# Patient Record
Sex: Female | Born: 1975 | Race: Black or African American | Hispanic: No | Marital: Married | Smoking: Never smoker
Health system: Southern US, Community
[De-identification: ages and names within clinical notes are randomized; demographics above are authoritative.]

## PROBLEM LIST (undated history)

## (undated) DIAGNOSIS — O3430 Maternal care for cervical incompetence, unspecified trimester: Secondary | ICD-10-CM

## (undated) DIAGNOSIS — E282 Polycystic ovarian syndrome: Secondary | ICD-10-CM

## (undated) DIAGNOSIS — O165 Unspecified maternal hypertension, complicating the puerperium: Secondary | ICD-10-CM

## (undated) DIAGNOSIS — Z9889 Other specified postprocedural states: Secondary | ICD-10-CM

## (undated) DIAGNOSIS — Z9079 Acquired absence of other genital organ(s): Secondary | ICD-10-CM

## (undated) HISTORY — PX: ECTOPIC PREGNANCY SURGERY: SHX613

## (undated) HISTORY — PX: SALPINGECTOMY: SHX328

---

## 2011-10-05 ENCOUNTER — Encounter: Payer: Self-pay | Admitting: *Deleted

## 2011-10-05 ENCOUNTER — Ambulatory Visit (INDEPENDENT_AMBULATORY_CARE_PROVIDER_SITE_OTHER): Payer: 59

## 2011-10-05 ENCOUNTER — Emergency Department (HOSPITAL_COMMUNITY): Payer: 59

## 2011-10-05 ENCOUNTER — Emergency Department (HOSPITAL_COMMUNITY)
Admission: EM | Admit: 2011-10-05 | Discharge: 2011-10-05 | Disposition: A | Discharge status: Home / Self Care | Payer: 59 | Attending: Emergency Medicine | Admitting: Emergency Medicine

## 2011-10-05 DIAGNOSIS — K429 Umbilical hernia without obstruction or gangrene: Secondary | ICD-10-CM

## 2011-10-05 DIAGNOSIS — N949 Unspecified condition associated with female genital organs and menstrual cycle: Secondary | ICD-10-CM | POA: Insufficient documentation

## 2011-10-05 DIAGNOSIS — R1033 Periumbilical pain: Secondary | ICD-10-CM

## 2011-10-05 DIAGNOSIS — R10813 Right lower quadrant abdominal tenderness: Secondary | ICD-10-CM | POA: Insufficient documentation

## 2011-10-05 DIAGNOSIS — M549 Dorsalgia, unspecified: Secondary | ICD-10-CM | POA: Insufficient documentation

## 2011-10-05 DIAGNOSIS — R10815 Periumbilic abdominal tenderness: Secondary | ICD-10-CM | POA: Insufficient documentation

## 2011-10-05 DIAGNOSIS — R109 Unspecified abdominal pain: Secondary | ICD-10-CM | POA: Insufficient documentation

## 2011-10-05 DIAGNOSIS — R10819 Abdominal tenderness, unspecified site: Secondary | ICD-10-CM | POA: Insufficient documentation

## 2011-10-05 DIAGNOSIS — R10814 Left lower quadrant abdominal tenderness: Secondary | ICD-10-CM | POA: Insufficient documentation

## 2011-10-05 DIAGNOSIS — R112 Nausea with vomiting, unspecified: Secondary | ICD-10-CM | POA: Insufficient documentation

## 2011-10-05 DIAGNOSIS — R630 Anorexia: Secondary | ICD-10-CM | POA: Insufficient documentation

## 2011-10-05 DIAGNOSIS — N83209 Unspecified ovarian cyst, unspecified side: Secondary | ICD-10-CM

## 2011-10-05 LAB — DIFFERENTIAL
Basophils Absolute: 0 10*3/uL (ref 0.0–0.1)
Basophils Relative: 0 % (ref 0–1)
Eosinophils Absolute: 0 10*3/uL (ref 0.0–0.7)
Eosinophils Relative: 1 % (ref 0–5)
Lymphocytes Relative: 25 % (ref 12–46)
Lymphs Abs: 2 10*3/uL (ref 0.7–4.0)
Monocytes Absolute: 0.3 10*3/uL (ref 0.1–1.0)
Monocytes Relative: 4 % (ref 3–12)
Neutro Abs: 5.5 10*3/uL (ref 1.7–7.7)
Neutrophils Relative %: 70 % (ref 43–77)

## 2011-10-05 LAB — URINALYSIS, ROUTINE W REFLEX MICROSCOPIC
Bilirubin Urine: NEGATIVE
Glucose, UA: NEGATIVE mg/dL
Ketones, ur: NEGATIVE mg/dL
Leukocytes, UA: NEGATIVE
Nitrite: NEGATIVE
Protein, ur: NEGATIVE mg/dL
Specific Gravity, Urine: 1.014 (ref 1.005–1.030)
Urobilinogen, UA: 0.2 mg/dL (ref 0.0–1.0)
pH: 7 (ref 5.0–8.0)

## 2011-10-05 LAB — COMPREHENSIVE METABOLIC PANEL
ALT: 27 U/L (ref 0–35)
AST: 23 U/L (ref 0–37)
Albumin: 4.5 g/dL (ref 3.5–5.2)
Alkaline Phosphatase: 83 U/L (ref 39–117)
BUN: 7 mg/dL (ref 6–23)
CO2: 28 mEq/L (ref 19–32)
Calcium: 9.9 mg/dL (ref 8.4–10.5)
Chloride: 104 mEq/L (ref 96–112)
Creatinine, Ser: 0.61 mg/dL (ref 0.50–1.10)
GFR calc Af Amer: >90 mL/min (ref >90)
GFR calc non Af Amer: >90 mL/min (ref >90)
Glucose, Bld: 123 mg/dL — ABNORMAL HIGH (ref 70–99)
Potassium: 3.8 mEq/L (ref 3.5–5.1)
Sodium: 139 mEq/L (ref 135–145)
Total Bilirubin: 0.3 mg/dL (ref 0.3–1.2)
Total Protein: 8.4 g/dL — ABNORMAL HIGH (ref 6.0–8.3)

## 2011-10-05 LAB — URINE MICROSCOPIC-ADD ON

## 2011-10-05 LAB — CBC
HCT: 38.5 % (ref 36.0–46.0)
Hemoglobin: 13.1 g/dL (ref 12.0–15.0)
MCH: 29.7 pg (ref 26.0–34.0)
MCHC: 34 g/dL (ref 30.0–36.0)
MCV: 87.3 fL (ref 78.0–100.0)
Platelets: 275 10*3/uL (ref 150–400)
RBC: 4.41 MIL/uL (ref 3.87–5.11)
RDW: 13.8 % (ref 11.5–15.5)
WBC: 7.9 10*3/uL (ref 4.0–10.5)

## 2011-10-05 LAB — LIPASE, BLOOD: Lipase: 20 U/L (ref 11–59)

## 2011-10-05 MED ORDER — IOHEXOL 300 MG/ML  SOLN
100.0000 mL | Freq: Once | INTRAMUSCULAR | Status: AC | PRN
  Administered 2011-10-05: 100 mL via INTRAVENOUS

## 2011-10-05 NOTE — ED Provider Notes (Signed)
History     CSN: [id number] Arrival date & time: 10/05/2011 11:01 AM   First MD Initiated Contact with Patient 10/05/11 1115      Chief Complaint  Patient presents with  . Hernia  . Abdominal Pain  . Nausea  . Emesis    (Consider location/radiation/quality/duration/timing/severity/associated sxs/prior treatment) Patient is a 35 y.o. female presenting with abdominal pain and vomiting. The history is provided by the patient.  Abdominal Pain The primary symptoms of the illness include abdominal pain, nausea and vomiting. The primary symptoms of the illness do not include diarrhea, dysuria, vaginal discharge or vaginal bleeding. The current episode started 13 to 24 hours ago. The onset of the illness was sudden.  The patient has not had a change in bowel habit. Risk factors for an acute abdominal problem include a history of abdominal surgery. Additional symptoms associated with the illness include anorexia and back pain. Symptoms associated with the illness do not include diaphoresis, heartburn, constipation, urgency, hematuria or frequency. Significant associated medical issues include diabetes.  Emesis  Associated symptoms include abdominal pain. Pertinent negatives include no diarrhea.    Penny [name] is a 35 y.o. female who presents to the emergency department from [facility] for abdominal pain.  Pt states pain began yesterday about 11 am and persisted throughout the night.  The pain was described as a 10/10 this located across the low abdomen, worst in the center up to her umbilicus and radiating to her low back.  The pain was described as sharp and constant.  Pt then began vomiting; denies hematemesis.  She had a BM this AM that was normal and she denies diarrhea.  Pt and her husband have been undergoing fertility treatments to become pregnant.  [id number].  R ectopic pregnancy in Aug 2012 requiring surgical intervention.  Home UPT was negative this AM.  LMP 4 weeks ago.   Pt has an umbilical hernia that has been present since birth.  Pt states no change in size, coloration or tenderness of this area.  Denies fever, malaise, CP, SOB, vaginal discharge or bleeding, dysuria, hematuria, frequency, urgency, constipation or hematochezia.    History reviewed. No pertinent past medical history.  Past Surgical History  Procedure Date  . Ectopic pregnancy surgery     No family history on file.  History  Substance Use Topics  . Smoking status: Never Smoker   . Smokeless tobacco: Not on file  . Alcohol Use: No    OB History    Grav Para Term Preterm Abortions TAB SAB Ect Mult Living                  Review of Systems  Constitutional: Negative for diaphoresis.  Gastrointestinal: Positive for nausea, vomiting, abdominal pain and anorexia. Negative for heartburn, diarrhea, constipation, blood in stool and abdominal distention.  Genitourinary: Negative for dysuria, urgency, frequency, hematuria, flank pain, vaginal bleeding, vaginal discharge, difficulty urinating and vaginal pain.  Musculoskeletal: Positive for back pain.   All pertinent positives and negatives in the history of present illness  Allergies  Review of patient's allergies indicates no known allergies.  Home Medications   Current Outpatient Rx  Name Route Sig Dispense Refill  . OMEGA-3 FATTY ACIDS 1000 MG PO CAPS Oral Take 1 g by mouth daily.      Marland Kitchen FOLIC ACID 400 MCG PO TABS Oral Take 800 mcg by mouth daily.      Marland Kitchen HYDROCODONE-ACETAMINOPHEN 5-500 MG PO TABS Oral Take 1 tablet  by mouth every 6 (six) hours as needed. PAIN     . METFORMIN HCL ER (OSM) 500 MG PO TB24 Oral Take 1,000 mg by mouth 2 (two) times daily.      . MULTI-VITAMIN/MINERALS PO TABS Oral Take 1 tablet by mouth daily.      Marland Kitchen VITAMIN C 500 MG PO TABS Oral Take 1,000 mg by mouth daily.        BP 149/86  Pulse 66  Temp 97.5 F (36.4 C)  Resp 16  SpO2 100%  LMP 09/14/2011  Physical Exam  Constitutional: She is  oriented to person, place, and time. She appears well-developed and well-nourished. No distress.  HENT:  Head: Normocephalic and atraumatic.  Right Ear: External ear normal.  Left Ear: External ear normal.  Eyes: Conjunctivae are normal. Pupils are equal, round, and reactive to light.  Neck: Normal range of motion. Neck supple.  Cardiovascular: Normal rate, regular rhythm, normal heart sounds and intact distal pulses.  Exam reveals no gallop and no friction rub.   No murmur heard. Pulmonary/Chest: Effort normal and breath sounds normal. No respiratory distress. She has no wheezes. She has no rales. She exhibits no tenderness.  Abdominal: Soft. Bowel sounds are normal. There is no hepatosplenomegaly. There is tenderness in the right lower quadrant, periumbilical area, suprapubic area and left lower quadrant. There is no rigidity, no rebound, no guarding and no CVA tenderness. A hernia (umbilical ) is present.    Musculoskeletal: Normal range of motion.  Lymphadenopathy:    She has no cervical adenopathy.  Neurological: She is alert and oriented to person, place, and time.  Skin: Skin is warm and dry. She is not diaphoretic.  Psychiatric: She has a normal mood and affect. Her behavior is normal. Judgment and thought content normal.    ED Course  Procedures (including critical care time)   Labs Reviewed  POCT PREGNANCY, URINE  POCT PREGNANCY, URINE      Patient has been stable during her emergency room visit.  Patient's pain is most likely associated with these multiple cystic areas on her enlarged right ovary.  She does have an umbilical hernia that has been present all of her life she states and does cause pain from time to time.  On my exam the patient did have umbilical hernia discomfort but is reducible and nonincarcerated.  Patient is advised to follow up with her GYN doctor for recheck.  Told to return here as needed for any worsening in her condition   MDM  MDM Reviewed:  nursing note and vitals Interpretation: labs, ultrasound and CT scan            Carlyle Dolly, PA-C 10/05/11 1833

## 2011-10-05 NOTE — ED Provider Notes (Signed)
Medical screening examination/treatment/procedure(s) were conducted as a shared visit with non-physician practitioner(s) and myself.  I personally evaluated the patient during the encounter  Lower abdominal pain since last night.  Associated with nausea.  Abdomen soft.  Easily reducible small umbilical hernia.  Glynn Octave, MD 10/05/11 [phone]

## 2011-10-05 NOTE — ED Notes (Signed)
Pt back from ct

## 2011-10-05 NOTE — ED Notes (Signed)
Pt sent from [facility] for eval for suspected incarcerated umbilical hernia. C/o low abd pain ,nausea, loss of appetite yesterday. Began vomiting this am.

## 2012-02-07 ENCOUNTER — Encounter (HOSPITAL_COMMUNITY): Payer: Self-pay

## 2012-02-07 ENCOUNTER — Emergency Department (HOSPITAL_COMMUNITY)
Admission: EM | Admit: 2012-02-07 | Discharge: 2012-02-07 | Disposition: A | Discharge status: Home / Self Care | Payer: Worker's Compensation | Attending: Emergency Medicine | Admitting: Emergency Medicine

## 2012-02-07 DIAGNOSIS — W19XXXA Unspecified fall, initial encounter: Secondary | ICD-10-CM

## 2012-02-07 DIAGNOSIS — M259 Joint disorder, unspecified: Secondary | ICD-10-CM | POA: Insufficient documentation

## 2012-02-07 DIAGNOSIS — M79609 Pain in unspecified limb: Secondary | ICD-10-CM | POA: Insufficient documentation

## 2012-02-07 DIAGNOSIS — M899 Disorder of bone, unspecified: Secondary | ICD-10-CM | POA: Insufficient documentation

## 2012-02-07 NOTE — ED Provider Notes (Signed)
Medical screening examination/treatment/procedure(s) were performed by non-physician practitioner and as supervising physician I was immediately available for consultation/collaboration.   Elridge Stemm, MD 02/07/12 2323 

## 2012-02-07 NOTE — Discharge Instructions (Signed)
Injuries In Pregnancy  Trauma is the most common cause of injury and death in pregnant women. The most common cause of death to the fetus is injury and death of the pregnant mother.   Minor falls and minor automobile accidents do not usually harm the fetus. The fetus is protected in the womb by a sac filled with fluid. The fetus can be harmed if there is direct trauma to your abdomen and pelvis. Direct trauma to the uterus and placenta can affect the blood supply to the fetus. Major trauma causing significant bleeding and shock to the mother can also compromise and jeopardize the fetal blood supply.  It is important to know your blood type and the father's blood type in case you develop vaginal bleeding. If you are RH negative and have sustained serious trauma or develop vaginal bleeding, you will need to have medicine (RhoGAM [Rh immune globulin]) to avoid Rh problems in future pregnancies.  CAUSES   Falls are more common in the second and third trimester of the pregnancy. Factors that increase your risk of falling include:   Increase in weight.   The change of your center of gravity.   Tripping over an object that cannot be seen.   Automobile accidents. It is important to wear a seat belt and always practice safe driving.   Domestic violence or assault. Dial your local emergency services (911 in the [place]). Spousal abuse can be a significant cause of trauma during pregnancy.   Burns (fire or electrical). Avoid fires, starting fires, lifting heavy pots of boiling or hot liquids, and fixing electrical problems.  The most common causes of death to the pregnant woman include:   Injuries that cause severe bleeding, shock and loss of blood flow to the mother's major organs.   Head and neck injuries that result in severe brain or spinal damage.   Chest trauma that can cause direct injury to the heart and lungs or any injury that effects the area enclosed by the ribs (thorax). Trauma to this area can result in  cardio-respiratory arrest.  Symptoms and treatment will depend on the type of injury.  HOME CARE INSTRUCTIONS    Call your caregiver if you are in a car accident, even if you think you and the baby are not hurt. Your caregiver may want you to have a precautionary evaluation.   Do not take aspirin. It can worsen bleeding.   You may apply cold packs 3 to 4 times a day to the injury with your caregiver's permission.   After 24 hours apply warm compresses to the injured site with your caregiver's permission.   Call your caregiver if you are having increasing pain in any part of your body that is not remedied by your instructed home care.   In a severe injury, try to have someone be with you and help you until you are able to take care of yourself.   Do not wear high heel shoes while pregnant.   Remove slippery rugs and loose objects on the floor.  SEEK IMMEDIATE MEDICAL CARE IF:    You have been assaulted (domestic or otherwise).   You have been in a car accident.   You develop vaginal bleeding.   You develop fluid leaking from the vagina.   You develop uterine contractions (pelvic cramping or pain).   You develop neck stiffness or pain.   You become weak or faint, or have uncontrolled vomiting after trauma.   You had a serious burn.   This includes burns to the face, neck, hands or genitals, or burns greater than the size of your palm anywhere else.   You develop a headache or vision problems after a fall or from other trauma.   You do not feel the baby moving or the baby is not moving as much as before.  Document Released: 11/10/2004 Document Revised: 09/22/2011 Document Reviewed: 02/15/2010  Exit[employer] Patient Information 2012 [employer].

## 2012-02-07 NOTE — ED Provider Notes (Signed)
History     CSN: [id number]  Arrival date & time 02/07/12  1831   First MD Initiated Contact with Patient 02/07/12 1920      Chief Complaint  Patient presents with  . Fall    (Consider location/radiation/quality/duration/timing/severity/associated sxs/prior treatment) HPI  Pt presents to the ED with complaints of having a fall while at work. She went to sit in a chair and it came out from behind her and she landed on her gluteus cheek and hit her left arm. She also admits to being 1 month pregnant. But she denies contractions, vaginal bleeding, back pain or abdominal pain. She came to the ED because her boss from work said she had to be medically cleared before she could come back. She is in no acute distress, denies my offer for pain medication saying that it does not hurt that bad.  History reviewed. No pertinent past medical history.  Past Surgical History  Procedure Date  . Ectopic pregnancy surgery     No family history on file.  History  Substance Use Topics  . Smoking status: Never Smoker   . Smokeless tobacco: Not on file  . Alcohol Use: No    OB History    Grav Para Term Preterm Abortions TAB SAB Ect Mult Living                  Review of Systems   HEENT: denies blurry vision or change in hearing PULMONARY: Denies difficulty breathing and SOB CARDIAC: denies chest pain or heart palpitations MUSCULOSKELETAL:  denies being unable to ambulate ABDOMEN AL: denies abdominal pain GU: denies loss of bowel or urinary control NEURO: denies numbness and tingling in extremities   Allergies  Review of patient's allergies indicates no known allergies.  Home Medications   Current Outpatient Rx  Name Route Sig Dispense Refill  . OMEGA-3 FATTY ACIDS 1000 MG PO CAPS Oral Take 1 g by mouth daily.      Marland Kitchen FOLIC ACID 400 MCG PO TABS Oral Take 800 mcg by mouth daily.      Marland Kitchen METFORMIN HCL ER (OSM) 500 MG PO TB24 Oral Take 1,000 mg by mouth 2 (two) times daily.      .  MULTI-VITAMIN/MINERALS PO TABS Oral Take 1 tablet by mouth daily.      Marland Kitchen VITAMIN C 500 MG PO TABS Oral Take 1,000 mg by mouth daily.        BP 141/79  Pulse 67  Temp(Src) 98.2 F (36.8 C) (Oral)  Resp 18  SpO2 100%  LMP 12/10/2011  Physical Exam  Nursing note and vitals reviewed. Constitutional: She appears well-developed and well-nourished. No distress.  HENT:  Head: Normocephalic and atraumatic.  Eyes: Pupils are equal, round, and reactive to light.  Neck: Normal range of motion. Neck supple.  Cardiovascular: Normal rate and regular rhythm.   Pulmonary/Chest: Effort normal.  Abdominal: Soft. Bowel sounds are normal. She exhibits distension (pt is pregnant). She exhibits no mass. There is no tenderness. There is no rebound and no guarding.  Musculoskeletal:       Right hip: Normal.       Left upper arm: She exhibits tenderness (to the bicep muscle- full strength and ROM) and bony tenderness. She exhibits no swelling, no edema, no deformity and no laceration.  Neurological: She is alert.  Skin: Skin is warm and dry.    ED Course  Procedures (including critical care time)  Labs Reviewed - No data to display No results  found.   1. Fall       MDM  Due to patient being pregnant, symptoms of her hip are not impressive and do not warrant xrays. The patient declined xray on her left humerus, saying she doesn't need it. She is seeing her OB doctor this week. We have described in length symptoms that should bring the patient back to the ED. I will start on prenatal vitamins.   Pt has been advised of the symptoms that warrant their return to the ED. Patient has voiced understanding and has agreed to follow-up with the PCP or specialist.         Dorthula Matas, PA 02/07/12 1943

## 2012-02-07 NOTE — ED Notes (Signed)
Pt in with c/o with right hip pain and left arm pain states fell denies head injury denies loc

## 2012-02-24 LAB — OB RESULTS CONSOLE HGB/HCT, BLOOD
HCT: 38 %
Hemoglobin: 13.2 g/dL

## 2012-02-24 LAB — OB RESULTS CONSOLE HIV ANTIBODY (ROUTINE TESTING): HIV: NONREACTIVE

## 2012-03-08 ENCOUNTER — Other Ambulatory Visit: Payer: Self-pay

## 2012-03-15 ENCOUNTER — Other Ambulatory Visit: Payer: Self-pay

## 2012-05-01 ENCOUNTER — Encounter (HOSPITAL_COMMUNITY): Payer: Self-pay | Admitting: Anesthesiology

## 2012-05-01 ENCOUNTER — Encounter (HOSPITAL_COMMUNITY): Payer: Self-pay | Admitting: *Deleted

## 2012-05-01 ENCOUNTER — Inpatient Hospital Stay (HOSPITAL_COMMUNITY)
Admission: AD | Admit: 2012-05-01 | Discharge: 2012-05-03 | DRG: 782 | Disposition: A | Discharge status: Home / Self Care | Payer: 59 | Source: Ambulatory Visit | Attending: Obstetrics & Gynecology | Admitting: Obstetrics & Gynecology

## 2012-05-01 ENCOUNTER — Inpatient Hospital Stay: Admit: 2012-05-01 | Payer: Self-pay | Admitting: Obstetrics and Gynecology

## 2012-05-01 ENCOUNTER — Encounter (HOSPITAL_COMMUNITY)
Admission: AD | Disposition: A | Discharge status: Home / Self Care | Payer: Self-pay | Source: Ambulatory Visit | Attending: Obstetrics & Gynecology

## 2012-05-01 ENCOUNTER — Ambulatory Visit (HOSPITAL_COMMUNITY)
Admission: RE | Admit: 2012-05-01 | Discharge: 2012-05-01 | Disposition: A | Discharge status: Home / Self Care | Payer: 59 | Source: Ambulatory Visit | Attending: Obstetrics and Gynecology | Admitting: Obstetrics and Gynecology

## 2012-05-01 ENCOUNTER — Encounter (HOSPITAL_COMMUNITY): Payer: Self-pay

## 2012-05-01 ENCOUNTER — Other Ambulatory Visit (HOSPITAL_COMMUNITY): Payer: Self-pay | Admitting: Obstetrics and Gynecology

## 2012-05-01 ENCOUNTER — Inpatient Hospital Stay (HOSPITAL_COMMUNITY): Payer: 59 | Admitting: Anesthesiology

## 2012-05-01 DIAGNOSIS — O3430 Maternal care for cervical incompetence, unspecified trimester: Secondary | ICD-10-CM

## 2012-05-01 DIAGNOSIS — Z3689 Encounter for other specified antenatal screening: Secondary | ICD-10-CM | POA: Insufficient documentation

## 2012-05-01 DIAGNOSIS — O26879 Cervical shortening, unspecified trimester: Secondary | ICD-10-CM | POA: Insufficient documentation

## 2012-05-01 HISTORY — DX: Maternal care for cervical incompetence, unspecified trimester: O34.30

## 2012-05-01 HISTORY — PX: CERVICAL CERCLAGE: SHX1329

## 2012-05-01 HISTORY — DX: Polycystic ovarian syndrome: E28.2

## 2012-05-01 LAB — CBC WITH DIFFERENTIAL/PLATELET
Eosinophils Absolute: 0.1 10*3/uL (ref 0.0–0.7)
Eosinophils Relative: 1 % (ref 0–5)
HCT: 38.9 % (ref 36.0–46.0)
Lymphs Abs: 2 10*3/uL (ref 0.7–4.0)
MCH: 29.6 pg (ref 26.0–34.0)
MCV: 88 fL (ref 78.0–100.0)
Monocytes Absolute: 0.5 10*3/uL (ref 0.1–1.0)
Platelets: 242 10*3/uL (ref 150–400)
RDW: 13.6 % (ref 11.5–15.5)

## 2012-05-01 SURGERY — CERCLAGE, CERVIX, VAGINAL APPROACH
Anesthesia: Spinal | Site: Vagina | Wound class: Clean Contaminated

## 2012-05-01 MED ORDER — DEXTROSE IN LACTATED RINGERS 5 % IV SOLN
INTRAVENOUS | Status: DC
  Administered 2012-05-02 (×2): via INTRAVENOUS

## 2012-05-01 MED ORDER — PROGESTERONE MICRONIZED 200 MG PO CAPS
200.0000 mg | ORAL_CAPSULE | Freq: Every day | ORAL | Status: DC
  Administered 2012-05-01 – 2012-05-02 (×2): 200 mg via VAGINAL
  Filled 2012-05-01 (×3): qty 1

## 2012-05-01 MED ORDER — FENTANYL CITRATE 0.05 MG/ML IJ SOLN: 25.0000 ug | INTRAMUSCULAR | Status: DC | PRN

## 2012-05-01 MED ORDER — PRENATAL MULTIVITAMIN CH
1.0000 | ORAL_TABLET | Freq: Every day | ORAL | Status: DC
  Administered 2012-05-01 – 2012-05-03 (×3): 1 via ORAL
  Filled 2012-05-01 (×5): qty 1

## 2012-05-01 MED ORDER — LIDOCAINE IN DEXTROSE 5-7.5 % IV SOLN
INTRAVENOUS | Status: AC
  Filled 2012-05-01: qty 2

## 2012-05-01 MED ORDER — LIDOCAINE IN DEXTROSE 5-7.5 % IV SOLN
INTRAVENOUS | Status: DC | PRN
  Administered 2012-05-01: 1 mL via INTRATHECAL

## 2012-05-01 MED ORDER — CITRIC ACID-SODIUM CITRATE 334-500 MG/5ML PO SOLN
30.0000 mL | Freq: Once | ORAL | Status: AC
  Administered 2012-05-01: 30 mL via ORAL
  Filled 2012-05-01: qty 15

## 2012-05-01 MED ORDER — FAMOTIDINE IN NACL 20-0.9 MG/50ML-% IV SOLN
20.0000 mg | Freq: Once | INTRAVENOUS | Status: AC
  Administered 2012-05-01: 20 mg via INTRAVENOUS
  Filled 2012-05-01: qty 50

## 2012-05-01 MED ORDER — ZOLPIDEM TARTRATE 5 MG PO TABS: 5.0000 mg | ORAL_TABLET | Freq: Every evening | ORAL | Status: DC | PRN

## 2012-05-01 MED ORDER — DOCUSATE SODIUM 100 MG PO CAPS
100.0000 mg | ORAL_CAPSULE | Freq: Every day | ORAL | Status: DC
  Administered 2012-05-01 – 2012-05-03 (×3): 100 mg via ORAL
  Filled 2012-05-01 (×5): qty 1

## 2012-05-01 MED ORDER — CEFAZOLIN SODIUM 1-5 GM-% IV SOLN
1.0000 g | Freq: Three times a day (TID) | INTRAVENOUS | Status: DC
  Administered 2012-05-01 – 2012-05-02 (×4): 1 g via INTRAVENOUS
  Filled 2012-05-01 (×4): qty 50

## 2012-05-01 MED ORDER — CALCIUM CARBONATE ANTACID 500 MG PO CHEW
2.0000 | CHEWABLE_TABLET | ORAL | Status: DC | PRN
  Filled 2012-05-01: qty 2

## 2012-05-01 MED ORDER — CEFAZOLIN SODIUM 1-5 GM-% IV SOLN
1.0000 g | Freq: Once | INTRAVENOUS | Status: AC
  Administered 2012-05-01: 1 g via INTRAVENOUS
  Filled 2012-05-01: qty 50

## 2012-05-01 MED ORDER — INDOMETHACIN 50 MG RE SUPP
50.0000 mg | Freq: Once | RECTAL | Status: AC
  Administered 2012-05-01: 50 mg via RECTAL
  Filled 2012-05-01: qty 1

## 2012-05-01 MED ORDER — ACETAMINOPHEN 325 MG PO TABS
650.0000 mg | ORAL_TABLET | ORAL | Status: DC | PRN
  Administered 2012-05-03: 650 mg via ORAL
  Filled 2012-05-01: qty 2

## 2012-05-01 MED ORDER — DEXTROSE IN LACTATED RINGERS 5 % IV SOLN
INTRAVENOUS | Status: DC
Start: 2012-05-01 — End: 2012-05-03
  Administered 2012-05-01 (×2): via INTRAVENOUS

## 2012-05-01 MED ORDER — LACTATED RINGERS IV SOLN
INTRAVENOUS | Status: DC | PRN
  Administered 2012-05-01: 18:00:00 via INTRAVENOUS

## 2012-05-01 MED ORDER — INDOMETHACIN 25 MG PO CAPS
25.0000 mg | ORAL_CAPSULE | Freq: Four times a day (QID) | ORAL | Status: AC
  Administered 2012-05-01 – 2012-05-02 (×4): 25 mg via ORAL
  Filled 2012-05-01 (×4): qty 1

## 2012-05-01 SURGICAL SUPPLY — 17 items
CATH ROBINSON RED A/P 16FR (CATHETERS) ×2 IMPLANT
CLOTH BEACON ORANGE TIMEOUT ST (SAFETY) ×2 IMPLANT
COUNTER NEEDLE 1200 MAGNETIC (NEEDLE) IMPLANT
GLOVE BIO SURGEON STRL SZ7.5 (GLOVE) ×4 IMPLANT
GOWN PREVENTION PLUS LG XLONG (DISPOSABLE) ×2 IMPLANT
GOWN PREVENTION PLUS XLARGE (GOWN DISPOSABLE) ×2 IMPLANT
NEEDLE MAYO .5 CIRCLE (NEEDLE) ×2 IMPLANT
NS IRRIG 1000ML POUR BTL (IV SOLUTION) ×2 IMPLANT
PACK VAGINAL MINOR WOMEN LF (CUSTOM PROCEDURE TRAY) ×2 IMPLANT
PAD PREP 24X48 CUFFED NSTRL (MISCELLANEOUS) ×2 IMPLANT
SUT ETHIBOND  5 (SUTURE) ×1
SUT ETHIBOND 5 (SUTURE) ×1 IMPLANT
SUT PROLENE 0 CT 1 30 (SUTURE) ×2 IMPLANT
TOWEL OR 17X24 6PK STRL BLUE (TOWEL DISPOSABLE) ×4 IMPLANT
TUBING NON-CON 1/4 X 20 CONN (TUBING) IMPLANT
WATER STERILE IRR 1000ML POUR (IV SOLUTION) IMPLANT
YANKAUER SUCT BULB TIP NO VENT (SUCTIONS) IMPLANT

## 2012-05-01 NOTE — Progress Notes (Signed)
Patient seen today  for ultrasound.  See full report in [facility].  Penny Gula, MD  Patient is referred due to suspected shortened cervix.  She has no known risk factors for incompetent cervix.  TVUS was performed.  U-shaped funneling is appreciated with fetal feet below the internal os.  There is almost no measurable cervix.  Sterile speculum exam reveals a closed external os - no evidence of hour-glassing membranes.  Single IUP at 18 3/7 weeks Suspected incompetent cervix with U-shaped funneling; almost no measurable cervical tissue No evidence of hour-glassing membranes on speculum exam  Findings were discussed with Dr. Billy [name].  Recommend admission - if no signs of infection/ uterine contractions she is still a candidate for rescue cerclage.   Would consider amniocentesis to rule out intra-amniotic infection if an elevated WBC count is noted or other clinical findings suspicious for infection.

## 2012-05-01 NOTE — Transfer of Care (Signed)
Immediate Anesthesia Transfer of Care Note  Patient: Penny [name]  Procedure(s) Performed: Procedure(s) (LRB): CERCLAGE CERVICAL (N/A)  Patient Location: PACU  Anesthesia Type: Spinal  Level of Consciousness: awake, alert  and oriented  Airway & Oxygen Therapy: Patient Spontanous Breathing  Post-op Assessment: Report given to PACU RN  Post vital signs: Reviewed and stable  Complications: No apparent anesthesia complications

## 2012-05-01 NOTE — H&P (Signed)
NAMEKANON, COLUNGA NO.:  000111000111  MEDICAL RECORD NO.:  [id number]  LOCATION:  9160                          FACILITY:  [facility]  PHYSICIAN:  Lenoard Aden, M.D.DATE OF BIRTH:  1976-04-17  DATE OF ADMISSION:  05/01/2012 DATE OF DISCHARGE:                             HISTORY & PHYSICAL   CHIEF COMPLAINT:  Cervical insufficiency for rescue cerclage.  HISTORY OF PRESENT ILLNESS:  She is a 36 year old African American female, G4, P0 at 18 weeks and 3 days gestation who presents now for rescue McDonald cerclage, cervical incompetence with no measurable cervix was noted upon ultrasound today.  On routine scan, MSN rescanned the patient and concurs with the need for cervical os.  There were no evidence of hourglassing membranes, but U shaped funneling of the cervix with no measurable cervix and likely eminent 2nd trimester loss.  ALLERGIES:  The patient has no known drug allergies.  MEDICATIONS:  Prenatal vitamins.  SOCIAL HISTORY:  She is a nonsmoker, nondrinker.  She denies domestic or physical violence.  MEDICAL HISTORY:  Remarkable for fibroids.  She has a history of PCOS, previously on metformin.  FAMILY HISTORY:  Unremarkable.  Previous pregnancy history for 2, 1st trimester losses with D and C and an ectopic pregnancy with right salpingectomy in 2012.  PHYSICAL EXAMINATION:  GENERAL:  She is a well-developed well-nourished [place] American female, in no acute distress. HEENT;  normal. NECK:  Supple.  Full range of motion. LUNGS:  Clear. HEART:  Regular rhythm. ABDOMEN:  Soft, gravid, nontender. GU:  Fetal heart tones auscultated.  Cervical exam reveals palpable membranes inside the internal os. EXTREMITIES:  There are no cords. NEUROLOGIC:  Nonfocal. SKIN:  Intact.  LABORATORY DATA:  CBC is within normal limits.  IMPRESSION:  Eighteen-week and 3-day intrauterine pregnancy with cervical insufficiency for rescue cerclage.  No evidence of  infection. No evidence of hourglassing membranes.  PLAN:  Proceed with McDonald cervical cerclage.  Risks of anesthesia, infection, bleeding, injury to cervix with possible need for repair discussed, possible risk of miscarriage with cervical oz due to a rupture of membranes and possible secondary infection discussed.  The patient acknowledges and wishes to proceed.     Lenoard Aden, M.D.     RJT/MEDQ  D:  05/01/2012  T:  05/01/2012  Job:  [id number]

## 2012-05-01 NOTE — ED Notes (Signed)
Report called to birthing suites charge RN.  Pt to go to room 160.  Will take in wheelchair.

## 2012-05-01 NOTE — Anesthesia Postprocedure Evaluation (Signed)
Anesthesia Post Note  Patient: Penny [name]  Procedure(s) Performed: Procedure(s) (LRB): CERCLAGE CERVICAL (N/A)  Anesthesia type: Spinal  Patient location: PACU  Post pain: Pain level controlled  Post assessment: Post-op Vital signs reviewed  Last Vitals:  Filed Vitals:   05/01/12 1915  BP: 114/53  Pulse: 66  Temp:   Resp: 20    Post vital signs: Reviewed  Level of consciousness: awake  Complications: No apparent anesthesia complications

## 2012-05-01 NOTE — Progress Notes (Signed)
Penny [name] is a 36 y.o. G4P0030 at [redacted]w[redacted]d by LMP admitted for Cervical insufficiency.  Subjective: No complaints  Objective: BP 140/75  Temp 97.6 F (36.4 C) (Oral)  Resp 22  Ht 5\' 5"  (1.651 m)  Wt 88.451 kg (195 lb)  BMI 32.45 kg/m2  LMP 12/12/2011      FHT:  140-150 UC:  None on toco SVE:    FT/ membranes palpable but not prolapsed No measurable by sono  Labs: Lab Results  Component Value Date   WBC 8.6 05/01/2012   HGB 13.1 05/01/2012   HCT 38.9 05/01/2012   MCV 88.0 05/01/2012   PLT 242 05/01/2012    Assessment / Plan: Cervical insufficiency - no evidence of infection. Proceed with Rescue Cerclage. MFM agrees with plan. Consent signed. Risks vs benefits discussed.  Labor: na Preeclampsia:  na Fetal Wellbeing:  na Pain Control:  na I/D:  n/a Anticipated MOD:  na  Markavious Micco J 05/01/2012, 5:41 PM

## 2012-05-01 NOTE — Anesthesia Preprocedure Evaluation (Addendum)
Anesthesia Evaluation  Patient identified by MRN, date of birth, ID band Patient awake    Reviewed: Allergy & Precautions, H&P , NPO status , Patient's Chart, lab work & pertinent test results, reviewed documented beta blocker date and time   History of Anesthesia Complications Negative for: history of anesthetic complications  Airway Mallampati: II TM Distance: >3 FB Neck ROM: full    Dental  (+) Teeth Intact,    Pulmonary neg pulmonary ROS,  breath sounds clear to auscultation        Cardiovascular negative cardio ROS  Rhythm:regular Rate:Normal     Neuro/Psych negative neurological ROS  negative psych ROS   GI/Hepatic negative GI ROS, Neg liver ROS,   Endo/Other  PCOS  Renal/GU negative Renal ROS  negative genitourinary   Musculoskeletal   Abdominal   Peds  Hematology negative hematology ROS (+)   Anesthesia Other Findings Last solid food 9 pm, last liquids  1 pm today  Reproductive/Obstetrics (+) Pregnancy (18 weeks, incompetent cervix)                          Anesthesia Physical Anesthesia Plan  ASA: II and Emergent  Anesthesia Plan: Spinal   Post-op Pain Management:    Induction:   Airway Management Planned:   Additional Equipment:   Intra-op Plan:   Post-operative Plan:   Informed Consent: I have reviewed the patients History and Physical, chart, labs and discussed the procedure including the risks, benefits and alternatives for the proposed anesthesia with the patient or authorized representative who has indicated his/her understanding and acceptance.   Dental Advisory Given  Plan Discussed with: Surgeon and CRNA  Anesthesia Plan Comments:         Anesthesia Quick Evaluation

## 2012-05-01 NOTE — Op Note (Signed)
Penny [name], Penny [name] NO.:  000111000111  MEDICAL RECORD NO.:  [id number]  LOCATION:  [facility]                          FACILITY:  [facility]  PHYSICIAN:  Lenoard Aden, M.D.DATE OF BIRTH:  November 24, 1975  DATE OF PROCEDURE: DATE OF DISCHARGE:                              OPERATIVE REPORT   DESCRIPTION OF PROCEDURE:  After being apprised of the risks of anesthesia, infection, bleeding, possible pregnancy loss due to cerclage.  The patient brought to the operating room where she was administered a spinal anesthetic without complications, prepped and draped in usual sterile fashion.  Catheterized, the bladder was empty. Exam under anesthesia reveals no evidence of measurable cervix and membranes at the external os.  No evidence of hourglassing membranes. At this time, the patient was placed in steep Trendelenburg position. The anterior lip of the cervix was grasped using a ring forcep.  At this time, a pursestring suture taking 6 multiple small circumferential bites from 1 o'clock circumferentially in a counter clockwise fashion, back to 1 o'clock are taken using a 5 Mersilene suture, this was then tied down upon a Prolene stitch.  There is no evidence of prolapsing membranes at the end of the procedure, the membranes were well reduced, but there is still a well-developed lower uterine segment apparent.  At this time, good hemostasis noted.  No prolapse membranes were noted.  No evidence of leakage of fluid.  Good hemostasis.  The patient tolerated the procedure well and was transferred to recovery in good condition.     Lenoard Aden, M.D.     RJT/MEDQ  D:  05/01/2012  T:  05/01/2012  Job:  [id number]

## 2012-05-01 NOTE — Progress Notes (Signed)
Transferred to OR by stretcher at this time.

## 2012-05-01 NOTE — Op Note (Signed)
05/01/2012  6:36 PM  PATIENT:  Penny [name]  36 y.o. female  PRE-OPERATIVE DIAGNOSIS:  incompetent cervix at 37 3/7 weeks  POST-OPERATIVE DIAGNOSIS:  incompetent cervix  PROCEDURE:  Procedure(s): CERCLAGE CERVICAL (Rescue)  SURGEON:  Surgeon(s): Lenoard Aden, MD  ASSISTANTS: none   ANESTHESIA:   spinal  ESTIMATED BLOOD LOSS: 20ml  DRAINS: none   LOCAL MEDICATIONS USED:  NONE  SPECIMEN:  No Specimen  DISPOSITION OF SPECIMEN:  N/A  COUNTS:  YES  DICTATION #: done  PLAN OF CARE: Admit to antenatal  PATIENT DISPOSITION:  PACU - hemodynamically stable.

## 2012-05-01 NOTE — Anesthesia Procedure Notes (Signed)
Spinal  Patient location during procedure: OR Start time: 05/01/2012 6:02 PM Staffing Performed by: anesthesiologist  Preanesthetic Checklist Completed: patient identified, site marked, surgical consent, pre-op evaluation, timeout performed, IV checked, risks and benefits discussed and monitors and equipment checked Spinal Block Patient position: sitting Prep: site prepped and draped and DuraPrep Patient monitoring: heart rate, continuous pulse ox and blood pressure Approach: midline Location: L3-4 Injection technique: single-shot Needle Needle type: Sprotte  Needle gauge: 24 G Needle length: 9 cm Assessment Sensory level: T10 Additional Notes Clear free flow CSF on first attempt.  No paresthesia.  Patient tolerated procedure well.  Jasmine December, MD

## 2012-05-02 ENCOUNTER — Inpatient Hospital Stay (HOSPITAL_COMMUNITY): Payer: 59

## 2012-05-02 ENCOUNTER — Encounter (HOSPITAL_COMMUNITY): Payer: Self-pay

## 2012-05-02 DIAGNOSIS — O3430 Maternal care for cervical incompetence, unspecified trimester: Secondary | ICD-10-CM

## 2012-05-02 HISTORY — DX: Maternal care for cervical incompetence, unspecified trimester: O34.30

## 2012-05-02 LAB — OB RESULTS CONSOLE ANTIBODY SCREEN: Antibody Screen: NEGATIVE

## 2012-05-02 LAB — OB RESULTS CONSOLE HIV ANTIBODY (ROUTINE TESTING): HIV: NONREACTIVE

## 2012-05-02 LAB — CBC
Hemoglobin: 12.6 g/dL (ref 12.0–15.0)
RBC: 4.29 MIL/uL (ref 3.87–5.11)
WBC: 8.1 10*3/uL (ref 4.0–10.5)

## 2012-05-02 LAB — OB RESULTS CONSOLE ABO/RH: RH Type: POSITIVE

## 2012-05-02 LAB — OB RESULTS CONSOLE RPR: RPR: NONREACTIVE

## 2012-05-02 NOTE — Addendum Note (Signed)
Addendum  created 05/02/12 0901 by Shanon Payor, CRNA   Modules edited:Notes Section

## 2012-05-02 NOTE — Anesthesia Postprocedure Evaluation (Signed)
  Anesthesia Post-op Note  Patient: Penny [name]  Procedure(s) Performed: Procedure(s) (LRB): CERCLAGE CERVICAL (N/A)  Patient Location: Antenatal  Anesthesia Type: Regional  Level of Consciousness: awake, alert  and oriented  Airway and Oxygen Therapy: Patient Spontanous Breathing  Post-op Pain: none  Post-op Assessment: Post-op Vital signs reviewed and Patient's Cardiovascular Status Stable  Post-op Vital Signs: Reviewed and stable  Complications: No apparent anesthesia complications

## 2012-05-02 NOTE — Progress Notes (Signed)
Patient ID: Penny [name], female   DOB: [birth year], 36 y.o.   MRN: [id number] HD#1 18 4/7 weeks Cervical Insufficiency  S: No complaints Occ spotting No LOF. Occ FM  O: BP 130/61  Pulse 68  Temp 98.3 F (36.8 C) (Oral)  Resp 18  Ht 5\' 5"  (1.651 m)  Wt 88.451 kg (195 lb)  BMI 32.45 kg/m2  SpO2 98%  LMP 12/12/2011   Lgs:CTA CV: RRR ABD: Gravid, soft  And non tender Neg CVAT EXT: neg c/c/e Neuro : nonfocal Skin : intact Pelvic: deferred  FHT 140-160 Toco- no contraction, occ UI  A: 18 4/7 week IUP Cervical Insufficiency s/p Rescue Cerclage  P: Ancef x 24 hrs. Indocin x 24 hrs. Repeat sono to ck CL tomorrow

## 2012-05-02 NOTE — Progress Notes (Signed)
Patient ID: Penny [name], female   DOB: Jun 09, 1976, 36 y.o.   MRN: [id number] HD#2 18 5/7 weeks Cervical Insufficiency   S:  No complaints  Occ spotting  No LOF. Occ FM   O: BP 75/58  Pulse 76  Temp 98.8 F (37.1 C) (Oral)  Resp 18  Ht 5\' 5"  (1.651 m)  Wt 88.451 kg (195 lb)  BMI 32.45 kg/m2  SpO2 98%  LMP 12/12/2011  CBC    Component Value Date/Time   WBC 8.1 05/02/2012 0545   RBC 4.29 05/02/2012 0545   HGB 12.6 05/02/2012 0545   HCT 38.0 05/02/2012 0545   PLT 226 05/02/2012 0545   MCV 88.6 05/02/2012 0545   MCH 29.4 05/02/2012 0545   MCHC 33.2 05/02/2012 0545   RDW 13.6 05/02/2012 0545   LYMPHSABS 2.0 05/01/2012 1543   MONOABS 0.5 05/01/2012 1543   EOSABS 0.1 05/01/2012 1543   BASOSABS 0.0 05/01/2012 1543      Lgs:CTA  CV: RRR  ABD: Gravid, soft And non tender  Neg CVAT  EXT: neg c/c/e  Neuro : nonfocal  Skin : intact  Pelvic: deferred   FHT 130-160  Toco- no contraction, occ UI   A:  18 5/7 week IUP  Cervical Insufficiency s/p Rescue Cerclage No s/s Chorio- afebrile and CBC stable.   P:  Sono fu to ck CL today DC pending sono

## 2012-05-02 NOTE — Progress Notes (Signed)
Ur chart review completed.  

## 2012-05-03 ENCOUNTER — Inpatient Hospital Stay (HOSPITAL_COMMUNITY): Payer: 59

## 2012-05-03 ENCOUNTER — Encounter (HOSPITAL_COMMUNITY): Payer: Self-pay | Admitting: Obstetrics and Gynecology

## 2012-05-03 LAB — CBC
HCT: 35.2 % — ABNORMAL LOW (ref 36.0–46.0)
Hemoglobin: 11.9 g/dL — ABNORMAL LOW (ref 12.0–15.0)
MCV: 88.7 fL (ref 78.0–100.0)
Platelets: 214 10*3/uL (ref 150–400)
RBC: 3.97 MIL/uL (ref 3.87–5.11)
WBC: 8.6 10*3/uL (ref 4.0–10.5)

## 2012-05-03 MED ORDER — PROGESTERONE MICRONIZED 200 MG PO CAPS: 200.0000 mg | ORAL_CAPSULE | Freq: Every day | ORAL | Status: DC

## 2012-05-03 NOTE — Progress Notes (Signed)
Patient ID: Penny [name], female   DOB: 1976-09-16, 36 y.o.   MRN: [id number] Pt is comfortable. BP 135/70  Pulse 85  Temp 98.6 F (37 C) (Oral)  Resp 20  Ht 5\' 5"  (1.651 m)  Wt 88.451 kg (195 lb)  BMI 32.45 kg/m2  SpO2 98%  LMP 12/12/2011  CL on sono- 1.1-1.3cm on sono. Cerclage intact. FHT 135 Will dc home and fu one week. Vaginal prometrium 200mg  qhs

## 2012-05-07 NOTE — Discharge Summary (Signed)
[name], [name] NO.:  [id number]  MEDICAL RECORD NO.:  [id number]  LOCATION:  9151                          FACILITY:  [facility]  PHYSICIAN:  Lenoard Aden, M.D.DATE OF BIRTH:  [birth year]  DATE OF ADMISSION:  05/01/2012 DATE OF DISCHARGE:  05/04/2012                              DISCHARGE SUMMARY   ADMISSION DIAGNOSIS:  Cervical insufficiency.  DISCHARGE DIAGNOSIS:  Cervical insufficiency status post __________.  HOSPITAL COURSE:  The patient was admitted at 18 weeks after a routine ultrasound for anatomy, revealed cervical insufficiency.  This was confirmed by maternal fetal medicine __________ and decision was then made to proceed with __________ cerclage which was performed without complication on May 01, 2012.  Postoperatively, the patient remained on Indocin and Ancef for 24 hours.  She remained afebrile, showed no signs and symptoms of chorioamnionitis.  Discharged to home on postop #2. Cerclage is intact at 1.2 cm.  No bleeding was noted.  She is to follow up in the office weekly.  DISCHARGE MEDICATIONS:  Include vaginal progesterone 200 mg at bedtime and prenatal vitamins.     Lenoard Aden, M.D.     RJT/MEDQ  D:  05/07/2012  T:  05/07/2012  Job:  [id number]

## 2012-05-15 ENCOUNTER — Encounter (HOSPITAL_COMMUNITY): Payer: Self-pay | Admitting: *Deleted

## 2012-05-15 ENCOUNTER — Inpatient Hospital Stay (HOSPITAL_COMMUNITY): Payer: 59

## 2012-05-15 ENCOUNTER — Inpatient Hospital Stay (HOSPITAL_COMMUNITY)
Admission: AD | Admit: 2012-05-15 | Discharge: 2012-05-19 | DRG: 781 | Disposition: A | Discharge status: Home / Self Care | Payer: 59 | Source: Ambulatory Visit | Attending: Obstetrics and Gynecology | Admitting: Obstetrics and Gynecology

## 2012-05-15 DIAGNOSIS — N390 Urinary tract infection, site not specified: Secondary | ICD-10-CM | POA: Diagnosis present

## 2012-05-15 DIAGNOSIS — O3430 Maternal care for cervical incompetence, unspecified trimester: Principal | ICD-10-CM | POA: Diagnosis present

## 2012-05-15 DIAGNOSIS — O2390 Unspecified genitourinary tract infection in pregnancy, unspecified trimester: Secondary | ICD-10-CM | POA: Diagnosis present

## 2012-05-15 LAB — CBC
MCHC: 33.4 g/dL (ref 30.0–36.0)
Platelets: 241 10*3/uL (ref 150–400)
RDW: 13.3 % (ref 11.5–15.5)
WBC: 11.5 10*3/uL — ABNORMAL HIGH (ref 4.0–10.5)

## 2012-05-15 LAB — URINALYSIS, ROUTINE W REFLEX MICROSCOPIC
Ketones, ur: NEGATIVE mg/dL
Nitrite: NEGATIVE
Protein, ur: NEGATIVE mg/dL
Urobilinogen, UA: 0.2 mg/dL (ref 0.0–1.0)

## 2012-05-15 LAB — URINE MICROSCOPIC-ADD ON

## 2012-05-15 MED ORDER — ACETAMINOPHEN 325 MG PO TABS: 650.0000 mg | ORAL_TABLET | ORAL | Status: DC | PRN

## 2012-05-15 MED ORDER — PROGESTERONE MICRONIZED 200 MG PO CAPS
200.0000 mg | ORAL_CAPSULE | Freq: Every day | ORAL | Status: DC
  Administered 2012-05-15 – 2012-05-18 (×4): 200 mg via VAGINAL
  Filled 2012-05-15 (×4): qty 1

## 2012-05-15 MED ORDER — CEPHALEXIN 500 MG PO CAPS
500.0000 mg | ORAL_CAPSULE | Freq: Three times a day (TID) | ORAL | Status: DC
  Administered 2012-05-15 – 2012-05-18 (×8): 500 mg via ORAL
  Filled 2012-05-15 (×9): qty 1

## 2012-05-15 MED ORDER — CALCIUM CARBONATE ANTACID 500 MG PO CHEW: 2.0000 | CHEWABLE_TABLET | ORAL | Status: DC | PRN

## 2012-05-15 MED ORDER — ZOLPIDEM TARTRATE 5 MG PO TABS: 5.0000 mg | ORAL_TABLET | Freq: Every evening | ORAL | Status: DC | PRN

## 2012-05-15 MED ORDER — DOCUSATE SODIUM 100 MG PO CAPS
100.0000 mg | ORAL_CAPSULE | Freq: Every day | ORAL | Status: DC
  Administered 2012-05-15 – 2012-05-19 (×5): 100 mg via ORAL
  Filled 2012-05-15 (×5): qty 1

## 2012-05-15 MED ORDER — PRENATAL MULTIVITAMIN CH
1.0000 | ORAL_TABLET | Freq: Every day | ORAL | Status: DC
  Administered 2012-05-15 – 2012-05-19 (×5): 1 via ORAL
  Filled 2012-05-15 (×5): qty 1

## 2012-05-15 NOTE — Progress Notes (Signed)
05/15/12 1200  Clinical Encounter Type  Visited With Patient and family together (Husband Penny [name])  Visit Type Initial;Spiritual support;Social support  Recommendations Spiritual Care will follow for sp/emotional support.  Spiritual Encounters  Spiritual Needs Emotional;Grief support ((Hx two miscarriages, one ectopic))  Stress Factors  Patient Stress Factors (Grief about previous losses)  Family Stress Factors (Grief about previous losses)    Made initial visit with Penny [name] and husband Penny [name], providing introduction to chaplain services and opportunity for them to share their story and process their grief about three previous losses.  They are hopeful and prayerful about this pregnancy, thanking God that they have come further than in the other three (two miscarriages and one ectopic).  Nevertheless, hope is guarded, and tears flow readily about their pain, grief, and gratitude.  They are welcoming of chaplain presence, were very appreciative of prayer, and are aware of ongoing chaplain availability.  Will continue to follow for spiritual, emotional, bereavement support.  [address] [place], [place] [phone]

## 2012-05-15 NOTE — Progress Notes (Signed)
Penny [name] is a 36 y.o. G4P0030 at [redacted]w[redacted]d by LMP admitted for Preterm previable cervical insufficiency s/p Rescue Cerclage.  Subjective: Spotting this am   Objective: BP 120/73  Pulse 79  Temp 98.4 F (36.9 C) (Oral)  Resp 20  Ht 5\' 5"  (1.651 m)  Wt 86.637 kg (191 lb)  BMI 31.78 kg/m2  LMP 12/12/2011      FHT:  145-155 No contractions noted SVE:    per office noted  Labs: CBC pending  Assessment / Plan: Preterm Previable cervical insufficiency 20 3/7 week IUP  Labor: na Preeclampsia:  na Fetal Wellbeing:  na Pain Control:  na I/D:  n/a Anticipated MOD:  na MFM consult pending  Zynia Wojtowicz J 05/15/2012, 1:04 PM

## 2012-05-15 NOTE — H&P (Signed)
Penny [name], ABRIL NO.:  [id number]  MEDICAL RECORD NO.:  [id number]  LOCATION:  9159                          [facility]ILITY:  [facility]  PHYSICIAN:  Lenoard Aden, M.D.DATE OF BIRTH:  Oct 31, 1975  DATE OF ADMISSION:  05/15/2012 DATE OF DISCHARGE:                             HISTORY & PHYSICAL   CHIEF COMPLAINT:  Spotting with a history of preterm cervical change, status post rescue cerclage on May 01, 2012.  HISTORY OF PRESENT ILLNESS:  She is a 36 year old African American female, G4, P0, who presents now with notable cervical change and spotting this morning.  The patient has a history of rescue cerclage on May 01, 2012 followed by an ultrasound, which reported cervical length post cerclage to be 1.1 cm.  She presented this morning with spotting and cervix was noted to be fingertip with minimal cervical length noted.  She is admitted for further triage and evaluation.  She has no known drug allergies.  MEDICATIONS:  Prenatal vitamins and vaginal progesterone.  She is a nonsmoker, nondrinker.  She denies domestic or physical violence.  She has a history of 2 SABs and an ectopic with right salpingectomy in 2012.  SURGICAL HISTORY:  Remarkable only for right salpingectomy.  FAMILY HISTORY:  Noncontributory.  PHYSICAL EXAMINATION:  GENERAL:  This is a well-developed, well- nourished [place] American female, in no apparent distress. VITAL SIGNS:  Stable.  The patient is afebrile. HEENT:  Normal. NECK:  Supple.  Full range of motion. LUNGS:  Clear. HEART:  Regular rhythm. ABDOMEN:  Soft, gravid, nontender.  Speculum exam reveals some thick brownish discharge.  The cerclage is intact at 360 degrees.  Cervix is 80% to 90% effaced and with membranes noted to the cervical os, but no hourglassing noted.  Cervix is about fingertip dilated. EXTREMITIES:  There are no cords. NEUROLOGIC:  Nonfocal. SKIN:  Intact.  Fetal heart tones are in the 140 to 150 beat per  minute range.  No evidence of contractions on Toco noted.  IMPRESSION: 1. A 20 and 3/7th-week intrauterine pregnancy. 2. History of cervical insufficiency, status post rescue cerclage with     notable preterm cervical change.  PLANa; MFM consult.  Check CBC.  Check urinalysis.  We will admit for inpatient observation at this time.     Lenoard Aden, M.D.     RJT/MEDQ  D:  05/15/2012  T:  05/15/2012  Job:  [id number]

## 2012-05-15 NOTE — Progress Notes (Signed)
Patient ID: Penny [name], female   DOB: 1976/10/04, 36 y.o.   MRN: [id number] HD #0 20 3/7 weeks with Cervical insufficiency  S: Occ brownish dc Occ lower abdominal cramping No BRB, denies contractions No CP or SOB  O: BP 137/80  Pulse 93  Temp 98.2 F (36.8 C) (Oral)  Resp 18  Ht 5\' 5"  (1.651 m)  Wt 86.637 kg (191 lb)  BMI 31.78 kg/m2  LMP 12/12/2011  NCAT Neck: supple with FROM Lungs:CTA CV:RRR ABD: Gravid, NT No CVAT Pelvic : deferred Neuro: nonfocal Skin: intact  CBC    Component Value Date/Time   WBC 11.5* 05/15/2012 1338   RBC 4.45 05/15/2012 1338   HGB 13.1 05/15/2012 1338   HGB 13.2 02/24/2012   HCT 39.2 05/15/2012 1338   HCT 38 02/24/2012   PLT 241 05/15/2012 1338   MCV 88.1 05/15/2012 1338   MCH 29.4 05/15/2012 1338   MCHC 33.4 05/15/2012 1338   RDW 13.3 05/15/2012 1338   LYMPHSABS 2.0 05/01/2012 1543   MONOABS 0.5 05/01/2012 1543   EOSABS 0.1 05/01/2012 1543   BASOSABS 0.0 05/01/2012 1543    UA with large blood and mod leuks Sono c/w Cerclage intact, CL  Now 8mm  A: 20 3/7 week IUP Cervical Insufficiency s/p Rescue Cerclage 7/18 Progressive Preterm Cervical change ? UTI  P: Treat empirically with Keflex Urine CS GBS

## 2012-05-15 NOTE — Consult Note (Signed)
Maternal Fetal Medicine Consultation  Requesting Provider(s): Olivia Mackie, MD  Reason for consultation: Incompetent cervix s/p cerclage with cervical shortening  HPI: Penny [name] is a 36 yo G4P0030 currently at 20 3/7 weeks who is currently admitted due to worsening cervical shortening / funneling s/p rescue cerlage on 7/16.  The patient was initially noted to have cervical funneling and shortening during a routine ultrasound earlier this month.  She underwent a McDonald rescue cerclage without complications.  She was initially noted to have a cervical length of 1.1 cm following the procedure.  On clinic evaluation earlier today, she was noted to have worsening cervical shortening.  She reports some vaginal spotting this AM without frank bleeding.  She denies uterine contractions.  She is otherwise without complaints.  OB History: OB History    Grav Para Term Preterm Abortions TAB SAB Ect Mult Living   4 0 0 0 3 0 2 1 0 0      Early SAB x 2 (< 12 weeks); Ectopic pregnancy x 1 PMH:  Past Medical History  Diagnosis Date  . Polycystic ovaries   . Cervical insufficiency in pregnancy, antepartum ([redacted]w[redacted]d) 05/02/2012  . McDonald cerclage present 05/02/2012    PSH:  Past Surgical History  Procedure Date  . Ectopic pregnancy surgery   . Salpingectomy     Right  . Cervical cerclage 05/01/2012    Procedure: CERCLAGE CERVICAL;  Surgeon: Lenoard Aden, MD;  Location: [facility];  Service: Gynecology;  Laterality: N/A;  rescue 18wks   Meds: Prenatal vitamins, Vaginal progesterone  Allergies: NKDA FH: neg for birth defects/ hereditary disorders Soc: Non-drinker, non-smoker, denies illicit drug use  Review of Systems: no vaginal bleeding currently or cramping/contractions, no LOF, no nausea/vomiting. All other systems reviewed and are negative.   PE:  VS:  Filed Vitals:   05/15/12 1552  BP: 127/64  Pulse: 92  Temp: 98.4 F (36.9 C)  Resp: 18   GEN: well-appearing female ABD: gravid,  NT  Please see separate document for fetal ultrasound report.  Single IUP at 20 3/7 weeks Limited ultrasound performed for cervical length TVUS - cerivcal length of 8 mm.  Cerclage stitch appears intact.  U-shaped funneling noted to the level of the stitch.    A/P: Cervical shortening/ funneling s/p rescue cerclage - concur with plan for inpatient observation/ bedrest.  Continue vaginal progesterone as written.  There does not appear to be any role for repeat cerclage - risks outweigh benefits and outcomes generally not felt to improve with repeat procedure.    Would recommend NICU consult as patient approaches viability (~ 23 weeks).  Would offer betamethasone at 23 weeks if the couple elects for intervention at that time.  Recommend weekly cervical lengths while hospitalized.  Thank you for the opportunity to be a part of the care of Penny [name]. Please contact our office if we can be of further assistance.   I spent approximately 15 minutes with this patient with over 50% of time spent in face-to-face counseling.  Alpha Gula, MD

## 2012-05-16 LAB — URINE CULTURE
Colony Count: NO GROWTH
Culture: NO GROWTH

## 2012-05-16 NOTE — Progress Notes (Signed)
Patient ID: Penny [name], female   DOB: 02-14-1976, 36 y.o.   MRN: [id number] HD #0  20 4/7 weeks with Cervical insufficiency   S:  Occ brownish dc now resolved Occ lower abdominal cramping and back cramping now improved since admission No BRB, denies contractions  No CP or SOB   O:  BP 137/80  Pulse 93  Temp 98.2 F (36.8 C) (Oral)  Resp 18  Ht 5\' 5"  (1.651 m)  Wt 86.637 kg (191 lb)  BMI 31.78 kg/m2  LMP 12/12/2011   NCAT  Neck: supple with FROM  Lungs:CTA  CV:RRR  ABD: Gravid, NT  No CVAT  Pelvic : deferred  Neuro: nonfocal  Skin: intact     Component  Value  Date/Time    WBC  11.5*  05/15/2012 1338    RBC  4.45  05/15/2012 1338    HGB  13.1  05/15/2012 1338    HGB  13.2  02/24/2012    HCT  39.2  05/15/2012 1338    HCT  38  02/24/2012    PLT  241  05/15/2012 1338    MCV  88.1  05/15/2012 1338    MCH  29.4  05/15/2012 1338    MCHC  33.4  05/15/2012 1338    RDW  13.3  05/15/2012 1338    LYMPHSABS  2.0  05/01/2012 1543    MONOABS  0.5  05/01/2012 1543    EOSABS  0.1  05/01/2012 1543    BASOSABS  0.0  05/01/2012 1543    UA with large blood and mod leuks  Sono c/w Cerclage intact, CL Now 8mm   A:  20 4/7 week IUP  Cervical Insufficiency s/p Rescue Cerclage 7/16 Progressive Preterm Cervical change  ? UTI - cramping, lg blood and leuks on UA,  Leukocytosis of ?etx- no clinical evidence of Chorio  P:  Treat empirically with Keflex Cath UA pending UCS sent Repeat CBC tomorrow GBS pending. Serial CL on sono. Will consider DC home until 23 weeks pending wu for infection.

## 2012-05-16 NOTE — Progress Notes (Signed)
Ur chart review completed.  

## 2012-05-17 LAB — CBC
Platelets: 243 10*3/uL (ref 150–400)
RBC: 4.41 MIL/uL (ref 3.87–5.11)
RDW: 13.2 % (ref 11.5–15.5)
WBC: 10.9 10*3/uL — ABNORMAL HIGH (ref 4.0–10.5)

## 2012-05-17 NOTE — Progress Notes (Signed)
Patient ID: Penny [name], female   DOB: [birth year], 36 y.o.   MRN: [id number] HD #1 20 5/7 weeks with Cervical insufficiency   S:  Occ brownish dc now resolved  Occ lower abdominal cramping and back cramping now improved since admission  No BRB, denies contractions  No CP or SOB   O:  BP 144/82  Pulse 86  Temp 97.7 F (36.5 C) (Oral)  Resp 18  Ht 5\' 5"  (1.651 m)  Wt 85.14 kg (187 lb 11.2 oz)  BMI 31.23 kg/m2  LMP 12/12/2011  CBC    Component Value Date/Time   WBC 10.9* 05/17/2012 0505   RBC 4.41 05/17/2012 0505   HGB 13.2 05/17/2012 0505   HGB 13.2 02/24/2012   HCT 38.8 05/17/2012 0505   HCT 38 02/24/2012   PLT 243 05/17/2012 0505   MCV 88.0 05/17/2012 0505   MCH 29.9 05/17/2012 0505   MCHC 34.0 05/17/2012 0505   RDW 13.2 05/17/2012 0505   LYMPHSABS 2.0 05/01/2012 1543   MONOABS 0.5 05/01/2012 1543   EOSABS 0.1 05/01/2012 1543   BASOSABS 0.0 05/01/2012 1543      NCAT  Neck: supple with FROM  Lungs:CTA  CV:RRR  ABD: Gravid, NT  No CVAT  Pelvic : deferred  Neuro: nonfocal  Skin: intact   UA with large blood and mod leuks  Sono c/w Cerclage intact, CL Now 8mm   A:  20 5/7 week IUP  Cervical Insufficiency s/p Rescue Cerclage 7/16  Progressive Preterm Cervical change  ? UTI - cramping, lg blood and leuks on UA,  Leukocytosis of ?etx slightly improved- no clinical evidence of Chorio   P:  Treat empirically with Keflex  Cath UA pending - not done by lab UCS sent  GBS pending Repeat CL tomorrow Possible DC home pending CL vs inpatient management.

## 2012-05-18 ENCOUNTER — Inpatient Hospital Stay (HOSPITAL_COMMUNITY): Payer: 59

## 2012-05-18 LAB — CULTURE, BETA STREP (GROUP B ONLY)

## 2012-05-18 MED ORDER — POLYETHYLENE GLYCOL 3350 17 G PO PACK
17.0000 g | PACK | Freq: Every day | ORAL | Status: DC | PRN
  Administered 2012-05-18: 17 g via ORAL
  Filled 2012-05-18: qty 1

## 2012-05-18 NOTE — Progress Notes (Signed)
Patient ID: Penny [name], female   DOB: 1976/06/17, 36 y.o.   MRN: [id number] HD #2 20 6/7 weeks with Cervical insufficiency   S:  Occ brownish dc now resolved  Occ lower abdominal cramping and back cramping now improved since admission  No BRB, denies contractions  No CP or SOB  Good FM, no LOF  O:  BP 130/73  Pulse 81  Temp 98.4 F (36.9 C) (Oral)  Resp 18  Ht 5\' 5"  (1.651 m)  Wt 85.14 kg (187 lb 11.2 oz)  BMI 31.23 kg/m2  LMP 12/12/2011  CBC    Component  Value  Date/Time    WBC  10.9*  05/17/2012 0505    RBC  4.41  05/17/2012 0505    HGB  13.2  05/17/2012 0505    HGB  13.2  02/24/2012    HCT  38.8  05/17/2012 0505    HCT  38  02/24/2012    PLT  243  05/17/2012 0505    MCV  88.0  05/17/2012 0505    MCH  29.9  05/17/2012 0505    MCHC  34.0  05/17/2012 0505    RDW  13.2  05/17/2012 0505    LYMPHSABS  2.0  05/01/2012 1543    MONOABS  0.5  05/01/2012 1543    EOSABS  0.1  05/01/2012 1543    BASOSABS  0.0  05/01/2012 1543    NCAT  Neck: supple with FROM  Lungs:CTA  CV:RRR  ABD: Gravid, NT  No CVAT  Pelvic : Cervix closed, posterior, presenting part out of pelvis No hour glassing membranes, cerclage intact Neuro: nonfocal  Skin: intact   FHT 160-165 Urine CS negative GBS negative  A:  20 6/7 week IUP  Cervical Insufficiency s/p Rescue Cerclage 7/16  Progressive Preterm Cervical change - stable Leukocytosis of ?etx slightly improved- no clinical evidence of Chorio  GBS negative and Urine CS negative  P:  DC Keflex now Repeat sono CL today Repeat CBC in am Will discuss plan of care with MFM pending sono today.

## 2012-05-18 NOTE — Progress Notes (Signed)
Penny [name] and her husband were in good spirits today after they received good news from the doctor that the baby was in a good position.  They were so grateful that Penny [name] has been able to carry her baby for this long as they have had 2 previous miscarriages and 1 ectopic pregnancy.  To them, this baby is a miracle.  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  They choose to see things positively and they have a strong faith.  We are aware that at different moments, many different emotions may come up for them and they are aware of our availability as chaplains to support them.  Please page as needed.  [phone].  Kathleen Argue 11:42 AM   05/18/12 1100  Clinical Encounter Type  Visited With Patient and family together  Visit Type Spiritual support  Referral From Chaplain  Spiritual Encounters  Spiritual Needs Prayer;Emotional  Stress Factors  Patient Stress Factors (History of loss.)

## 2012-05-19 LAB — CBC
HCT: 39 % (ref 36.0–46.0)
Hemoglobin: 13.2 g/dL (ref 12.0–15.0)
MCHC: 33.8 g/dL (ref 30.0–36.0)
RBC: 4.44 MIL/uL (ref 3.87–5.11)
WBC: 10.4 10*3/uL (ref 4.0–10.5)

## 2012-05-19 NOTE — Progress Notes (Signed)
Patient ID: Penny [name], female   DOB: [birth year], 36 y.o.   MRN: [id number] HD#3  21 0/7 weeks with Cervical insufficiency   S:  Occ brownish dc now resolved  Occ lower abdominal cramping and back cramping now improved since admission  No BRB, denies contractions  No CP or SOB  Good FM, no LOF   O:  BP 126/75  Pulse 91  Temp 98.2 F (36.8 C) (Oral)  Resp 20  Ht 5\' 5"  (1.651 m)  Wt 85.14 kg (187 lb 11.2 oz)  BMI 31.23 kg/m2  LMP 12/12/2011  CBC    Component  Value  Date/Time    WBC  10.9*     RBC  4.41  05/17/2012 0505    HGB  13.2  05/17/2012 0505    HGB  13.2  02/24/2012    HCT  38.8  05/17/2012 0505    HCT  38  02/24/2012    PLT  243  05/17/2012 0505    MCV  88.0  05/17/2012 0505    MCH  29.9  05/17/2012 0505    MCHC  34.0  05/17/2012 0505    RDW  13.2  05/17/2012 0505    LYMPHSABS  2.0  05/01/2012 1543    MONOABS  0.5  05/01/2012 1543    EOSABS  0.1     BASOSABS  0.0     CBC    Component Value Date/Time   WBC 10.4 05/19/2012 0506   RBC 4.44 05/19/2012 0506   HGB 13.2 05/19/2012 0506   HGB 13.2 02/24/2012   HCT 39.0 05/19/2012 0506   HCT 38 02/24/2012   PLT 260 05/19/2012 0506   MCV 87.8 05/19/2012 0506   MCH 29.7 05/19/2012 0506   MCHC 33.8 05/19/2012 0506   RDW 13.2 05/19/2012 0506   LYMPHSABS 2.0 05/01/2012 1543   MONOABS 0.5 05/01/2012 1543   EOSABS 0.1 05/01/2012 1543   BASOSABS 0.0 05/01/2012 1543     NCAT  Neck: supple with FROM  Lungs:CTA  CV:RRR  ABD: Gravid, NT  No CVAT  Pelvic :deferred Neuro: nonfocal  Skin: intact   FHT 160-165  Urine CS negative  GBS negative  Sono 8/2 with stable CL at 1cm, cerclage intact  A:  21 0/7 week IUP  Cervical Insufficiency s/p Rescue Cerclage 7/16  Progressive Preterm Cervical change - stable on sono and exam Leukocytosis of ?etx now resolved- no clinical evidence of Chorio  GBS negative and Urine CS negative   P:  DC home Fu MFM for CL 8/9 FU offcie 8/12 Bedrest with BRP at home Vaginal prometrium qhs

## 2012-05-19 NOTE — Progress Notes (Signed)
Discharge instructions reviewed with pt and husband. Receptive. Discharged via wheelchair.

## 2012-05-20 ENCOUNTER — Encounter (HOSPITAL_COMMUNITY): Payer: Self-pay | Admitting: *Deleted

## 2012-05-20 ENCOUNTER — Inpatient Hospital Stay (HOSPITAL_COMMUNITY)
Admission: AD | Admit: 2012-05-20 | Discharge: 2012-06-17 | DRG: 765 | Disposition: A | Discharge status: Home / Self Care | Payer: 59 | Source: Ambulatory Visit | Attending: Obstetrics and Gynecology | Admitting: Obstetrics and Gynecology

## 2012-05-20 DIAGNOSIS — O26879 Cervical shortening, unspecified trimester: Secondary | ICD-10-CM | POA: Diagnosis present

## 2012-05-20 DIAGNOSIS — O3430 Maternal care for cervical incompetence, unspecified trimester: Principal | ICD-10-CM | POA: Diagnosis present

## 2012-05-20 DIAGNOSIS — K219 Gastro-esophageal reflux disease without esophagitis: Secondary | ICD-10-CM | POA: Diagnosis not present

## 2012-05-20 DIAGNOSIS — O411090 Infection of amniotic sac and membranes, unspecified, unspecified trimester, not applicable or unspecified: Secondary | ICD-10-CM | POA: Diagnosis not present

## 2012-05-20 DIAGNOSIS — O4590 Premature separation of placenta, unspecified, unspecified trimester: Secondary | ICD-10-CM | POA: Diagnosis not present

## 2012-05-20 DIAGNOSIS — K5900 Constipation, unspecified: Secondary | ICD-10-CM | POA: Diagnosis not present

## 2012-05-20 DIAGNOSIS — O209 Hemorrhage in early pregnancy, unspecified: Secondary | ICD-10-CM | POA: Diagnosis present

## 2012-05-20 DIAGNOSIS — N883 Incompetence of cervix uteri: Secondary | ICD-10-CM

## 2012-05-20 DIAGNOSIS — R030 Elevated blood-pressure reading, without diagnosis of hypertension: Secondary | ICD-10-CM | POA: Diagnosis not present

## 2012-05-20 DIAGNOSIS — Z3689 Encounter for other specified antenatal screening: Secondary | ICD-10-CM

## 2012-05-20 DIAGNOSIS — O99892 Other specified diseases and conditions complicating childbirth: Secondary | ICD-10-CM | POA: Diagnosis not present

## 2012-05-20 LAB — CBC
Platelets: 252 10*3/uL (ref 150–400)
RBC: 4.34 MIL/uL (ref 3.87–5.11)
RDW: 13.1 % (ref 11.5–15.5)
WBC: 9.4 10*3/uL (ref 4.0–10.5)

## 2012-05-20 LAB — URINALYSIS, ROUTINE W REFLEX MICROSCOPIC
Nitrite: NEGATIVE
Specific Gravity, Urine: 1.02 (ref 1.005–1.030)
Urobilinogen, UA: 0.2 mg/dL (ref 0.0–1.0)

## 2012-05-20 LAB — URINE MICROSCOPIC-ADD ON

## 2012-05-20 MED ORDER — PROGESTERONE MICRONIZED 200 MG PO CAPS
200.0000 mg | ORAL_CAPSULE | Freq: Every day | ORAL | Status: DC
  Administered 2012-05-20 – 2012-06-11 (×23): 200 mg via VAGINAL
  Filled 2012-05-20 (×24): qty 1

## 2012-05-20 MED ORDER — ACETAMINOPHEN 325 MG PO TABS
650.0000 mg | ORAL_TABLET | ORAL | Status: DC | PRN
  Administered 2012-05-31 – 2012-06-12 (×8): 650 mg via ORAL
  Filled 2012-05-20 (×8): qty 2

## 2012-05-20 MED ORDER — PROGESTERONE MICRONIZED 200 MG PO CAPS
200.0000 mg | ORAL_CAPSULE | Freq: Every day | ORAL | Status: DC
  Filled 2012-05-20: qty 1

## 2012-05-20 MED ORDER — CALCIUM CARBONATE ANTACID 500 MG PO CHEW
2.0000 | CHEWABLE_TABLET | ORAL | Status: DC | PRN
  Administered 2012-06-02 – 2012-06-03 (×3): 400 mg via ORAL
  Filled 2012-05-20: qty 2
  Filled 2012-05-20: qty 1
  Filled 2012-05-20 (×2): qty 2

## 2012-05-20 MED ORDER — PRENATAL MULTIVITAMIN CH
1.0000 | ORAL_TABLET | Freq: Every day | ORAL | Status: DC
  Administered 2012-05-20 – 2012-06-12 (×24): 1 via ORAL
  Filled 2012-05-20 (×24): qty 1

## 2012-05-20 MED ORDER — DOCUSATE SODIUM 100 MG PO CAPS
100.0000 mg | ORAL_CAPSULE | Freq: Every day | ORAL | Status: DC
  Administered 2012-05-20 – 2012-05-26 (×7): 100 mg via ORAL
  Filled 2012-05-20 (×7): qty 1

## 2012-05-20 MED ORDER — ZOLPIDEM TARTRATE 5 MG PO TABS
5.0000 mg | ORAL_TABLET | Freq: Every evening | ORAL | Status: DC | PRN
  Administered 2012-06-12: 5 mg via ORAL
  Filled 2012-05-20: qty 1

## 2012-05-20 NOTE — MAU Note (Signed)
Patient taken directly to room 2 in MAU from lobby via wheelchair c/o small amount of vaginal bleeding, patients husband states she was just discharged from the hospital yesterday has cerclage.

## 2012-05-20 NOTE — H&P (Signed)
Penny [name] is a 36 y.o. female presenting for cervical incompetence with rescue cerclage in place.  HPP:  Was d/c from antenatal unit yesterday.  Spotting this am.  Last [place] Cervical length 1 cm/closed/cerclage intact.  No abdo-pelvic pain.  No UC felt.    Maternal Medical History:  Fetal activity: Perceived fetal activity is normal.   Last perceived fetal movement was within the past hour.      OB History    Grav Para Term Preterm Abortions TAB SAB Ect Mult Living   4 0 0 0 3 0 2 1 0 0      Past Medical History  Diagnosis Date  . Polycystic ovaries   . Cervical insufficiency in pregnancy, antepartum ([redacted]w[redacted]d) 05/02/2012  . McDonald cerclage present 05/02/2012   Past Surgical History  Procedure Date  . Ectopic pregnancy surgery   . Salpingectomy     Right  . Cervical cerclage 05/01/2012    Procedure: CERCLAGE CERVICAL;  Surgeon: Lenoard Aden, MD;  Location: [facility];  Service: Gynecology;  Laterality: N/A;  rescue 18wks   Family History: family history is not on file. Social History:  reports that she has never smoked. She does not have any smokeless tobacco history on file. She reports that she does not drink alcohol or use illicit drugs.   Prenatal Transfer Tool  Maternal Diabetes: No Genetic Screening: Normal Maternal Ultrasounds/Referrals: Normal Fetal Ultrasounds or other Referrals:  Other: see prenatal record Maternal Substance Abuse:  No  ROS  Dilation: 1 Exam by:: Dr. Seymour Bars by visualization with speculum Blood pressure 134/77, pulse 88, temperature 97.9 F (36.6 C), resp. rate 18, last menstrual period 12/12/2011.  FHT 160/min  Membranes seen at cervix, intact.  Exam Physical Exam  Prenatal labs: ABO, Rh: O/Positive/-- (07/17 0000) Antibody: Negative (07/17 0000) Rubella: Immune (07/17 0000) RPR: Nonreactive (07/17 0000)  HBsAg: Negative (05/10 0000)  HIV: Non-reactive (07/17 0000)  GBS:  unknown  Assessment/Plan: 21 1/7 wks multiparous with  incompetent cervix, rescue cerclage, now dilated to 1 cm with membranes visible at cervix, intact.  Decision to readmit.  [place] as much as possible.  See orders.  Will repeat US.  Penny [name],Penny [name] 05/20/2012, 9:44 AM

## 2012-05-20 NOTE — MAU Note (Signed)
Scant amount of blood noted on pad.

## 2012-05-20 NOTE — MAU Note (Signed)
Called Dr. Seymour Bars with report, patient discharged from antenatal unit yesterday was hospitalized with vaginal bleeding patient woke this morning and noted that after going to bathroom wiped and had blood on tissue x 2, denies pain, fhr 160, per Dr. Seymour Bars was told to call Dr. Billy Coast to see if he would like to see his patient or have her perform a speculum exam, paged Dr. Billy Coast.

## 2012-05-21 ENCOUNTER — Inpatient Hospital Stay (HOSPITAL_COMMUNITY): Payer: 59

## 2012-05-21 LAB — WET PREP, GENITAL: Trich, Wet Prep: NONE SEEN

## 2012-05-21 NOTE — Progress Notes (Signed)
Patient seen today  for follow up TVUS ultrasound.  See full report in [facility].  Alpha Gula, MD  Single IUP at 21 2/7 weeks Limited ultrasound performed for cervical length TVUS - almost no measurable cerival tissue (~ 2 mm).  Cerclage stitch appears intact.  V-shaped funneling noted to the level of the stitch.    Recommend continued inpatient observation, bedrest.  Follow up cervical length in 1 week.

## 2012-05-21 NOTE — Progress Notes (Signed)
Ur chart review completed.  

## 2012-05-21 NOTE — Progress Notes (Signed)
This was a follow-up visit with Penny [name] after she was re-admitted.  She was discouraged, but very much trying to stay positive and hopeful.  She is grateful for the support of her husband and for all of the care from staff.  We shared a prayer and I offered compassionate listening.  Please page as needed, [phone].  Agnes Lawrence Madison Albea 12:52 PM   05/21/12 1200  Clinical Encounter Type  Visited With Patient  Visit Type Follow-up;Spiritual support  Referral From Nurse  Spiritual Encounters  Spiritual Needs Prayer;Emotional  Stress Factors  Patient Stress Factors (Hx of loss, Re-admitted during pregnancy)

## 2012-05-21 NOTE — Progress Notes (Signed)
S: feels well. C/o pelvic pressure and feeling like suture is tearing  O: VSS afebrile (+) FHR 155 Lungs clear to A Cor RRR Abd: gravid soft nontender Extremity no calf tenderness Spec exam: creamy yellowish vaginal d/c. (+) suture noted. Cervix: FT/ shortened cervix intact. Wet prep done  Wet prep neg except many wbc IMP: cervical incompetence w/ cerclage and shortened cervix 2nd trimester vag bleeding IUP @ 21 3/7 week P) cont inpt mgmt. Pt reassured.

## 2012-05-22 MED ORDER — POLYETHYLENE GLYCOL 3350 17 G PO PACK
17.0000 g | PACK | Freq: Every day | ORAL | Status: DC
  Administered 2012-05-22 – 2012-06-12 (×20): 17 g via ORAL
  Filled 2012-05-22 (×23): qty 1

## 2012-05-22 NOTE — Progress Notes (Signed)
S: c/o of no BM since last Sat. (+) FM denies vaginal bleeding/spotting.   O: VSS Afebrile FHR 155 Lungs clear to A  Cor: RRR Abd; gravid nontender Pelvic: deferred Pad no drainage  IMP: Cervical incompetence w/ cerclage 2nd trimester vaginal bleeding IUP @ 21 3/7 wk  P) remain inpt. Miralax. Weekly CL. SCD hose

## 2012-05-23 MED ORDER — FAMOTIDINE 20 MG PO TABS
20.0000 mg | ORAL_TABLET | Freq: Every day | ORAL | Status: DC
  Administered 2012-05-23 – 2012-06-02 (×11): 20 mg via ORAL
  Filled 2012-05-23 (×11): qty 1

## 2012-05-23 NOTE — Progress Notes (Signed)
Patient ID: Penny [name], female   DOB: 08/06/1976, 36 y.o.   MRN: [id number] No pain/ bleeding/ cramping/ abn vag discharge/pelvic pressure. Feels fetal mvmts.  No focal s/s i/e dysuria/ URI symptoms/ HAs/SOB. No heartburn, but will start Pepcid due to positional GERD. Also PT to see pt, will benefit, feels weak when using bedside commode. Shoulder pain from weight on head/shoulders. PT should help, may consider shoulder pads to assist, warm compresses.   Objective: Vital signs in last 24 hours: Temp:  [97.6 F (36.4 C)-98.4 F (36.9 C)] 98.4 F (36.9 C) (08/07 1556) Pulse Rate:  [81-88] 83  (08/07 1556) Resp:  [18] 18  (08/07 1556) BP: (120-145)/(63-98) 137/64 mmHg (08/07 1556) Weight:  [193 lb 8 oz (87.771 kg)] 193 lb 8 oz (87.771 kg) (08/07 1003) Weight change:    Physical exam:  A&O x 3, no acute distress. Pleasant Lungs CTA bil CV no tachycardia  Abdo soft, uterus non tender Extr no edema/ tenderness, SCDs on. Pelvic deferred FHT  Present 150s each shift.  Toco none noted on monitor or palpated.    Assessment/Plan: LOS: 3 days  36 yo, G4P0030 at 21.4/7 wks, cervical incompetence, admitted with bleeding that has resolved since. Cervix dilating and funeling with cerclage in place. CL wk'ly  Plan to keep in [place] as tolerated and have PT evaluate.  No bleeding/ infection, continue expectant care for now.   Penny [name] R 05/23/2012, 5:07 PM

## 2012-05-23 NOTE — Evaluation (Signed)
Physical Therapy Evaluation Patient Details Name: Penny [name] MRN: [id number] DOB: 29-Dec-1975 Today's Date: 05/23/2012 Time: 4098-1191 PT Time Calculation (min): 19 min  PT Assessment / Plan / Recommendation Clinical Impression  patient demonstrated understanding of all exercises, positioning and back stretch.  Patient may benefit from kpad to neck secondary neck stiffness and occasional pain.  Will monitor patient for changes and reassess as indicated.    PT Assessment  Patient needs continued PT services    Follow Up Recommendations  No PT follow up             Frequency Other (Comment) (will monitor via chart for changes)    Precautions / Restrictions Precautions Precaution Comments: bedrest, trendelenberg   Pertinent Vitals/Pain n/a      Mobility  Bed Mobility Bed Mobility: Rolling Right;Rolling Left Rolling Right: 6: Modified independent (Device/Increase time);With rail Rolling Left: 6: Modified independent (Device/Increase time);With rail Details for Bed Mobility Assistance: discussed positioning with patient.  Discussed using pillows to support back, between legs, etc.  Discussed using all positions (sidelying, belly up; sidelying belly down, etc).      Exercises Antenatal Exercises Ankle Circles/Pumps: AROM;Both;10 reps;Supine Quad Sets: Supine;AROM;Both;10 reps Short Arc Quad: AROM;Both;10 reps;Supine Hip ABduction/ADduction: AROM;Both;10 reps;Supine Sidelying Hip Flexor Stretch: PROM;Both   PT Diagnosis: Generalized weakness  PT Problem List: Decreased strength;Decreased range of motion;Decreased mobility   Visit Information  Last PT Received On: 05/23/12 Assistance Needed: +1    Subjective Data  Subjective: patient reported some stiffness in neck Patient Stated Goal: none stated   Prior Functioning       Cognition  Overall Cognitive Status: Appears within functional limits for tasks assessed/performed Arousal/Alertness: Awake/alert Orientation  Level: Appears intact for tasks assessed Behavior During Session: [facility] for tasks performed    Extremity/Trunk Assessment Right Upper Extremity Assessment RUE ROM/Strength/Tone: [facility] for tasks assessed Left Upper Extremity Assessment LUE ROM/Strength/Tone: [facility] for tasks assessed Right Lower Extremity Assessment RLE ROM/Strength/Tone: [facility] for tasks assessed Left Lower Extremity Assessment LLE ROM/Strength/Tone: [facility] for tasks assessed   Balance    End of Session PT - End of Session Activity Tolerance: Patient tolerated treatment well Patient left: in bed  GP     Olivia Canter, [place] [phone] 05/23/2012, 12:07 PM

## 2012-05-24 NOTE — Progress Notes (Signed)
Patient ID: Dawnisha Marquina, female   DOB: June 20, 1976, 36 y.o.   MRN: [id number] Feels well, no complaints but ROS (+) shoulder and neck pain from position. No vag blding/ abn discharge/ leaking fluid/ pelvic pressure. Saw PT.   Objective: Vital signs in last 24 hours: Temp:  [97.9 F (36.6 C)-98.6 F (37 C)] 98.2 F (36.8 C) (08/08 1218) Pulse Rate:  [80-90] 88  (08/08 1218) Resp:  [18] 18  (08/08 1218) BP: (113-153)/(64-72) 123/64 mmHg (08/08 1218) Weight change:    Physical exam:  A&O x3, pleasant, NAD. Abdo soft, uterus non tender Extr no edema/ tenderness, SCDs on. Pelvic deferred FHT  Present 150s each shift.  Toco none   Assessment/Plan: LOS: 5 days  36 yo, G4P0030 at 21.5/7 wks, cervical incompetence, admitted with bleeding and cervical funeling. Sono next wk. No further bleeding. Managing well in trendelenburg.  No bleeding/ infection, continue expectant care for now.   Brandelyn Henne R 05/24/2012, 3:58 PM

## 2012-05-24 NOTE — Progress Notes (Signed)
UR Chart review completed.  

## 2012-05-25 ENCOUNTER — Inpatient Hospital Stay (HOSPITAL_COMMUNITY): Admit: 2012-05-25 | Payer: Self-pay

## 2012-05-25 NOTE — Progress Notes (Signed)
Pt up to bsc  Successful BM

## 2012-05-25 NOTE — Progress Notes (Signed)
Patient ID: Penny [name], female   DOB: 1975/12/28, 36 y.o.   MRN: [id number] Feels well, constipation, will give prune juice and Miralax prn.  Denies vag blding/ abn discharge/ leaking fluid/ pelvic pressure.   Objective: Vital signs in last 24 hours: Temp:  [98.2 F (36.8 C)-99.1 F (37.3 C)] 98.4 F (36.9 C) (08/09 0756) Pulse Rate:  [82-90] 84  (08/09 0756) Resp:  [16-24] 24  (08/09 0756) BP: (119-139)/(58-69) 119/59 mmHg (08/09 0756) Weight change:    Physical exam:  A&O x3, pleasant, NAD. Abdo soft, uterus non tender Extr no edema/ tenderness, SCDs on. Pelvic deferred, no staining on pad FHT  Present 150s each shift.  Toco none   Assessment/Plan:  36 yo, G4P0030 at 21.6/7 wks.  Cervical incompetence, admitted with bleeding and cervical funeling. Sono next wk. No further bleeding. Managing well in trendelenburg.   No bleeding/ infection, continue expectant care for now.  Constipation mngmt reviewed. No depression.   Penny [name] R 05/25/2012, 11:06 AM

## 2012-05-26 MED ORDER — DOCUSATE SODIUM 100 MG PO CAPS
100.0000 mg | ORAL_CAPSULE | Freq: Two times a day (BID) | ORAL | Status: DC
  Administered 2012-05-26 – 2012-06-12 (×34): 100 mg via ORAL
  Filled 2012-05-26 (×33): qty 1

## 2012-05-26 NOTE — Progress Notes (Signed)
Patient ID: Penny [name], female   DOB: Oct 12, 1976, 36 y.o.   MRN: [id number] Feels well, BM yest but pt notes firm movement. Denies vag blding or leakage of fluid but notes some brown d/c with wiping.  No pelvic pressure, no contractions, no tenderness, no fevers, active fetal movement. Pt states doing OK in T-berg position  Objective: Vital signs in last 24 hours: Temp:  [97.9 F (36.6 C)-98.4 F (36.9 C)] 98.2 F (36.8 C) (08/10 0910) Pulse Rate:  [84-101] 85  (08/10 0910) Resp:  [18-20] 20  (08/10 0910) BP: (119-133)/(59-82) 119/76 mmHg (08/10 0910) Weight change:    Physical exam:  A&O x3, pleasant, NAD. Abdo soft, uterus non tender Extr no edema/ tenderness, SCDs on. Pelvic deferred, no staining on pad FHT  Present 150s each shift.  Toco none   Assessment/Plan:  36 yo, G4P0030 at 22'0 wks Cervical incompetence, admitted with bleeding and cervical funeling. Sono next wk. No further bleeding. Managing well in trendelenburg.   No bleeding/ infection, continue expectant care for now.  Constipation mngmt reviewed, will increase to Colace bid No depression.  Fetal testing appropriate  Flavio Lindroth A. 05/26/2012, 10:46 AM

## 2012-05-27 NOTE — Progress Notes (Signed)
Patient ID: Penny [name], female   DOB: Jun 15, 1976, 36 y.o.   MRN: [id number] Feels well. Denies vag blding or leakage of fluid but notes some brown d/c with wiping, pt thinks from vaginal progesterone.  No pelvic pressure, no contractions, no tenderness, no fevers, active fetal movement. Pt states doing OK in T-berg position  Objective: Vital signs in last 24 hours: Temp:  [97.5 F (36.4 C)-98.5 F (36.9 C)] 98.3 F (36.8 C) (08/11 0732) Pulse Rate:  [76-104] 87  (08/11 0732) Resp:  [20] 20  (08/11 0732) BP: (119-135)/(58-81) 134/81 mmHg (08/11 0732) Weight change:    Physical exam:  A&O x3, pleasant, NAD. Abdo soft, uterus non tender Extr no edema/ tenderness, SCDs on. Pelvic deferred, no staining on pad FHT  Present 150s each shift.  Toco none   Assessment/Plan:  36 yo, G4P0030 at 22'1 wks Cervical incompetence, admitted with bleeding and cervical funeling. Sono next wk. No further bleeding. Managing well in trendelenburg.   No bleeding/ infection, continue expectant care for now.  Constipation mngmt reviewed, increased to Colace bid No depression.  Fetal testing appropriate  Glenn Gullickson A. 05/27/2012, 10:36 AM

## 2012-05-28 NOTE — Progress Notes (Signed)
Patient ID: Penny [name], female   DOB: [birth year], 36 y.o.   MRN: [id number] 22 2/7 weeks Cervical Insufficiency  S: No bleeding. Occ brownish dc Good FM, No contractions. No CP or SOB. No LOF noted. Occ low back pain  O: BP 133/73  Pulse 78  Temp 98.2 F (36.8 C) (Oral)  Resp 16  Ht 5\' 5"  (1.651 m)  Wt 87.771 kg (193 lb 8 oz)  BMI 32.20 kg/m2  LMP 12/12/2011  Normal appearing female in NAD Head: NCAT Neck: supple with FROM Lgs: CTA CV: RRR ABD: Gravid , NT No CVAT Ext: Neg c/c/e, SCDs intact Neuro: non focal Skin: intact  FHT 125-145  A: 22 2/7 week IUP Cervical Insufficiency s/p Rescue cerclage GBS neg and Urine CS neg with no s/s of Chorio  P; Sono to ck CL tomorrow Remain inpatient Will consider pessary

## 2012-05-28 NOTE — Progress Notes (Signed)
Ur chart review completed.  

## 2012-05-29 ENCOUNTER — Other Ambulatory Visit (HOSPITAL_COMMUNITY): Payer: Self-pay

## 2012-05-29 ENCOUNTER — Inpatient Hospital Stay (HOSPITAL_COMMUNITY): Payer: 59

## 2012-05-29 ENCOUNTER — Ambulatory Visit (HOSPITAL_COMMUNITY): Payer: 59

## 2012-05-29 NOTE — Progress Notes (Signed)
Patient requests to remain in trendelenburg position

## 2012-05-29 NOTE — Progress Notes (Signed)
Patient ID: Penny [name], female   DOB: [birth year], 36 y.o.   MRN: [id number] 22 3/7 weeks  Cervical Insufficiency   S:  No bleeding.  Occ brownish dc  Good FM, No contractions.  No CP or SOB.  No LOF noted.  Occ low back pain   O:  BP 127/68  Pulse 88  Temp 98.6 F (37 C) (Oral)  Resp 18  Ht 5\' 5"  (1.651 m)  Wt 87.771 kg (193 lb 8 oz)  BMI 32.20 kg/m2  LMP 12/12/2011   Normal appearing female in NAD  Head: NCAT  Neck: supple with FROM  Lgs: CTA  CV: RRR  ABD: Gravid , NT  No CVAT  Ext: Neg c/c/e, SCDs intact  Neuro: non focal  Skin: intact  FHT 125-145   A:  22 3/7 week IUP  Cervical Insufficiency s/p Rescue cerclage  GBS neg and Urine CS neg with no s/s of Chorio   P;  Sono to ck CL today Remain inpatient status Will consider pessary Discussed with MFM Will dc Trendelenberg and allow BRP

## 2012-05-30 NOTE — Progress Notes (Signed)
Patient ID: Penny [name], female   DOB: 1976-06-06, 37 y.o.   MRN: [id number] 22 4/7 weeks  Cervical Insufficiency   S:  No bleeding.  Occ brownish/yellowish dc  Good FM, No contractions.  No CP or SOB.  No LOF noted.  Occ low back pain   O:  BP 121/62  Pulse 97  Temp 98.5 F (36.9 C) (Oral)  Resp 20  Ht 5\' 5"  (1.651 m)  Wt 87.771 kg (193 lb 8 oz)  BMI 32.20 kg/m2  LMP 12/12/2011 Normal appearing female in NAD  Head: NCAT  Neck: supple with FROM  Lgs: CTA  CV: RRR  ABD: Gravid , NT  No CVAT  Ext: Neg c/c/e, SCDs intact  Neuro: non focal  Skin: intact   FHT 125-145  Sono yesterday c/w funnelling down to cerclage.  Vaginal exam on 8/12 revealed stitch to be intact with no dilatation or prolapsed membranes noted.  A:  22 4/7 week IUP  Cervical Insufficiency s/p Rescue cerclage - likely poor prognosis now with progressive funnelling down to cerclage GBS neg and Urine CS neg with no s/s of Chorio   P;  Sono to ck CL weekly  Remain inpatient status  Will consider pessary  Discussed with MFM  Pt prefers Trendelenberg and declines BRP

## 2012-05-30 NOTE — Progress Notes (Signed)
05/30/12 1000  Clinical Encounter Type  Visited With Patient and family together (Husband and adopted "grandmother" Penny [name] (90y))  Visit Type Follow-up;Spiritual support;Social support  Referral From Nurse Lupita Leash, RN)  Spiritual Encounters  Spiritual Needs Emotional;Prayer  Stress Factors  Patient Stress Factors (Anxiety about losing baby)  Family Stress Factors (Anxiety about losing baby)    Followed up with Penny [name] and Penny [name] to provide extra support as they, per nurse Lupita Leash, learned that cervix is now even less likely to hold until viability.  Neither pt nor husband talked about that news or concern; they direct their energy to thanking God for this pregnancy and each other, praying for safe, healthy delivery.  Their mantra is "every day counts."    Worked to meet them where they are emotionally, praying with thanksgiving and for strength, per request.  Will continue to follow for support.  Encouraged family to reach out, as well, as desired.  They request ongoing prayer.  [address] [place], [place] [phone]

## 2012-05-30 NOTE — Progress Notes (Signed)
Pt c/o feeling fluttering inside vagina.  Pt desires to remain in trendelenburg.

## 2012-05-31 ENCOUNTER — Encounter (HOSPITAL_COMMUNITY): Payer: Self-pay | Admitting: *Deleted

## 2012-05-31 MED ORDER — BETAMETHASONE SOD PHOS & ACET 6 (3-3) MG/ML IJ SUSP
12.0000 mg | INTRAMUSCULAR | Status: AC
  Administered 2012-05-31 – 2012-06-01 (×2): 12 mg via INTRAMUSCULAR
  Filled 2012-05-31 (×2): qty 2

## 2012-05-31 NOTE — Consult Note (Signed)
Asked to provide prenatal consultation for patient at risk for preterm delivery due to incompetent cervix.  Mother is 36y.o. G4 P0 who is now 23.[redacted] weeks EGA, with rescue cerclage in place and pessary placed today.  Discussed usual expectations for preterm infant at 75 - [redacted] weeks gestation, including possible needs for DR resuscitation, respiratory support, IV access, and blood products.  Also presented risks of death or serious morbidity, including lung disease and neurodevelopmental disability.  Projected possible length of stay in NICU until 36 - [redacted] wks EGA.  Discussed advantages of feeding with mother's milk, and encouraged her to consider pumping postnatally.  Patient was attentive, had appropriate questions, and was appreciative of my input.  Thank you for the consultation.

## 2012-05-31 NOTE — Progress Notes (Signed)
Patient ID: Penny [name], female   DOB: 09/09/1976, 36 y.o.   MRN: [id number] 23 0/7 weeks  Cervical Insufficiency   S:  No bleeding.  Occ brownish/yellowish dc  Good FM, No contractions.  No CP or SOB.  No LOF noted.  Occ low back pain   O:  BP 135/78  Pulse 75  Temp 98.5 F (36.9 C) (Oral)  Resp 18  Ht 5\' 5"  (1.651 m)  Wt 85.458 kg (188 lb 6.4 oz)  BMI 31.35 kg/m2  LMP 12/12/2011   Head: NCAT  Neck: supple with FROM  Lgs: CTA  CV: RRR  ABD: Gravid , NT  No CVAT  Ext: Neg c/c/e, SCDs intact  Neuro: non focal  Skin: intact   Pessary fitting with #2 incontinence ring. Pt tolerated procedure well.  Cerclage intact. No prolapsing membranes noted.  FHT 125-145   Sono 8/13 c/w funnelling down to cerclage.    A:  23 0/7 week IUP  Cervical Insufficiency s/p Rescue cerclage -  Pessary fitted. GBS neg and Urine CS neg with no s/s of Chorio   P;  Sono to ck CL weekly  Sono to ck growth next week NICU consult Remain inpatient status  Discussed with MFM

## 2012-05-31 NOTE — Progress Notes (Signed)
[redacted] weeks gestation, with incompetent cervix.  Height  65"  Weight 188 Lbs pre-pregnancy weight 194 Lbs.Pre-pregnancy  BMI 32.3  IBW 125  Total weight gain none, current weight 6 lbs below pre-pregnancy weight. Weight gain goals 11-20 Lbs.   Estimated needs: 1750-1950 kcal/day, 71-84 grams protein/day, 2.3 liters fluid/day regular diet tolerated well, appetite good. Pt assures me she is eating 3 meals per day. Husband often present to help cut food. She is pushing fluids, including milk. Changed diet order to antenatal regular to allow 3 snacks per day. Snack menu given to pt. Encouraged her to order between meals to increase po intake. Current diet prescription will provide for increased needs. No abnormal nutrition related labs  Nutrition Dx: Increased nutrient needs r/t pregnancy and fetal growth requirements aeb [redacted] weeks gestation.  No educational needs assessed at this time.  Elisabeth Cara M.Odis Luster LDN Neonatal Nutrition Support Specialist Pager [phone]

## 2012-05-31 NOTE — Progress Notes (Signed)
UR Chart review completed.  

## 2012-06-01 ENCOUNTER — Other Ambulatory Visit (HOSPITAL_COMMUNITY): Payer: Self-pay

## 2012-06-01 NOTE — Progress Notes (Signed)
Patient ID: Penny [name], female   DOB: June 27, 1976, 36 y.o.   MRN: [id number] 23 1/7 weeks  Cervical Insufficiency   S:  No bleeding.  Occ brownish/yellowish dc  Good FM, No contractions.  No CP or SOB.  No LOF noted.  Occ low back pain   O:  BP 131/76  Pulse 78  Temp 98.1 F (36.7 C) (Oral)  Resp 18  Ht 5\' 5"  (1.651 m)  Wt 85.458 kg (188 lb 6.4 oz)  BMI 31.35 kg/m2  LMP 12/12/2011   Head: NCAT  Neck: supple with FROM  Lgs: CTA  CV: RRR  ABD: Gravid , NT  No CVAT  Ext: Neg c/c/e, SCDs intact  Neuro: non focal  Skin: intact   FHT 156-168  Sono 8/13 c/w funnelling down to cerclage.   A:  23 1/7 week IUP  Cervical Insufficiency s/p Rescue cerclage - Pessary placed 8/15 GBS neg and Urine CS neg with no s/s of Chorioamnionits  P;  Sono to ck CL weekly  Sono to ck growth next week  NICU consult done- will resuscitate per NICU status Remain inpatient status  Discussed with MFM

## 2012-06-02 NOTE — Discharge Summary (Signed)
Penny [name], KAMROWSKI NO.:  [id number]  MEDICAL RECORD NO.:  [id number]  LOCATION:  9159                          FACILITY:  [facility]  PHYSICIAN:  Lenoard Aden, M.D.DATE OF BIRTH:  02-Jan-1976  DATE OF ADMISSION:  05/15/2012 DATE OF DISCHARGE:  05/19/2012                              DISCHARGE SUMMARY   The patient was admitted for cervical insufficiency, was placed on bed rest.  Cervical length was re-established by serial ultrasounds.  No prolapse membranes.  No evidence of infection during her hospital course.  Fetal heart tones remained stable, within normal limits.  She was discharged to home on May 19, 2012, stable for followup in the office within 1 week.  She will remain in home with bed rest.  DISCHARGE MEDICATIONS:  Prenatal vitamins.  Discharge instructions given.     Lenoard Aden, M.D.     RJT/MEDQ  D:  06/01/2012  T:  06/02/2012  Job:  [id number]

## 2012-06-02 NOTE — Progress Notes (Signed)
See other note - this note "locked" when changing screens after template initiated. I closed this note and re-documented patient encounter.  Marlinda Mike CNM, MSN

## 2012-06-02 NOTE — Progress Notes (Signed)
Patient ID: Penny [name], female   DOB: 12-Nov-1975, 36 y.o.   MRN: [id number]   Antenatal Note - HD #14 ( readmission 8/4) Incompetent cervix with rescue cerclage at 23 2/7 weeks  S:  Reports feeling well - denies any concerns today / good spririts             Tolerating po/ no nausea or vomiting / normal BM (ambulatory only for BM)             Some reflux - resolved with antacid prn             Active FM / no cramping or pain / no bleeding or discharge             Tolerating pessary without any sensation or urinary symptoms             O:  A & O x 3 / trendelenberg             VS: Blood pressure 129/71, pulse 93, temperature 98.7 F (37.1 C), temperature source Oral, resp. rate 20, height 5\' 5"  (1.651 m), weight 85.458 kg (188 lb 6.4 oz), last menstrual period 12/12/2011.  Lungs: Clear and unlabored  Heart: regular rate and rhythm / no mumurs  Abdomen: soft, non-tender, non-distended              Uterus:  non-tender  Extremities: no edema, no calf pain or tenderness, SCD in place              FHR 150-160    A: 23 weeks and 2/7 days pregnant             Incompetent cervix - rescue cerclage and pessary in place   Doing well - stable status - remain inpt  P:  Continue current management plan per Dr Rande Lawman CNM, MSN 06/02/2012, 11:36 AM

## 2012-06-03 MED ORDER — ALUM & MAG HYDROXIDE-SIMETH 200-200-20 MG/5ML PO SUSP
30.0000 mL | Freq: Once | ORAL | Status: AC
  Administered 2012-06-03: 30 mL via ORAL
  Filled 2012-06-03: qty 30

## 2012-06-03 MED ORDER — FAMOTIDINE 20 MG PO TABS
20.0000 mg | ORAL_TABLET | Freq: Two times a day (BID) | ORAL | Status: DC
  Administered 2012-06-03 (×2): 20 mg via ORAL
  Filled 2012-06-03 (×2): qty 1

## 2012-06-03 MED ORDER — PANTOPRAZOLE SODIUM 40 MG PO TBEC
40.0000 mg | DELAYED_RELEASE_TABLET | Freq: Two times a day (BID) | ORAL | Status: DC
  Administered 2012-06-03 – 2012-06-12 (×19): 40 mg via ORAL
  Filled 2012-06-03 (×21): qty 1

## 2012-06-03 NOTE — Progress Notes (Signed)
Patient ID: [name], female   DOB: 1976-03-18, 36 y.o.   MRN: [id number] 23 3/7 weeks  Cervical Insufficiency   S:  No bleeding.  Occ brownish/yellowish dc  Good FM, No contractions.  No CP or SOB.  No LOF noted.  Occ low back pain  O:  BP 132/80  Pulse 77  Temp 98.3 F (36.8 C) (Oral)  Resp 20  Ht 5\' 5"  (1.651 m)  Wt 85.458 kg (188 lb 6.4 oz)  BMI 31.35 kg/m2  LMP 12/12/2011  Head: NCAT  Neck: supple with FROM  Lgs: CTA  CV: RRR  ABD: Gravid , NT  No CVAT  Ext: Neg c/c/e, SCDs intact  Neuro: non focal  Skin: intact   FHT 155-165  Sono 8/13 c/w funnelling down to cerclage.   A:  23 3/7 week IUP  Cervical Insufficiency s/p Rescue cerclage - Pessary placed 8/15  GBS neg and Urine CS neg with no s/s of Chorioamnionits  GERD- symptomatic  P;  Sono to ck CL weekly  Sono to ck growth this week  NICU consult done- will resuscitate per NICU status  Remain inpatient status  Discussed with MFM  DC Pepcid and start Protonix

## 2012-06-04 NOTE — Progress Notes (Signed)
Ur chart review completed.  

## 2012-06-04 NOTE — Progress Notes (Signed)
Pt called out complaining of twinges in vaginal area. Refused for me to look at this time. Wants to finish eating lunch first. Instructed pt to call when finished eating.

## 2012-06-04 NOTE — Progress Notes (Signed)
Patient ID: Penny [name], female   DOB: 26-Dec-1975, 36 y.o.   MRN: [id number] Penny Aden, MD Physician Signed Obstetrics Progress Notes 06/03/2012 2:11 PM   Patient ID: Penny [name], female DOB: [birth year], 36 y.o. MRN: [id number]  23 4/7 weeks  Cervical Insufficiency   S:  No bleeding.  Occ brownish/yellowish dc  Good FM, No contractions.  No CP or SOB.  No LOF noted.  Occ low back pain  No discomfort with pessary  O:  BP 122/60  Pulse 70  Temp 98 F (36.7 C) (Oral)  Resp 18  Ht 5\' 5"  (1.651 m)  Wt 85.458 kg (188 lb 6.4 oz)  BMI 31.35 kg/m2  LMP 12/12/2011   Head: NCAT  Neck: supple with FROM  Lgs: CTA  CV: RRR  ABD: Gravid , NT  No CVAT  Ext: Neg c/c/e, SCDs intact  Neuro: non focal  Skin: intact   FHT 155-174  Sono 8/13 c/w funnelling down to cerclage.   A:  23 4/7 week IUP  Cervical Insufficiency s/p Rescue cerclage - Pessary placed 8/15  GBS neg and Urine CS neg with no s/s of Chorioamnionits  GERD- symptomatic   P;  Sono to ck CL and ck interval growth tomorrow NICU consult done- will resuscitate per NICU status  Remain inpatient status  Discussed with MFM  Continue Protonix BMZ complete

## 2012-06-04 NOTE — Progress Notes (Signed)
This was a follow-up visit with Penny [name].  She was in very good spirits and seemed grateful to be as far along as she is now.  She sees her time in the hospital now as an act of love for her baby.  She and her husband seem to be continuing to support each other well and they remain optimistic and hopeful.  We shared a prayer together at [name] request.    Please page as needs arise or as patient requests, [phone].  [name] 1:03 PM   06/04/12 1300  Clinical Encounter Type  Visited With Patient  Visit Type Follow-up;Spiritual support  Spiritual Encounters  Spiritual Needs Prayer

## 2012-06-05 ENCOUNTER — Inpatient Hospital Stay (HOSPITAL_COMMUNITY): Payer: 59

## 2012-06-05 NOTE — Progress Notes (Signed)
Patient ID: Penny [name], female   DOB: November 03, 1975, 36 y.o.   MRN: [id number] 23 5/7 weeks  Cervical Insufficiency   S:  No bleeding.  Occ brownish/yellowish dc  Good FM, No contractions.  No CP or SOB.  No LOF noted.  Occ low back pain  No discomfort with pessary   O:  BP 140/85  Pulse 75  Temp 98.4 F (36.9 C) (Oral)  Resp 20  Ht 5\' 5"  (1.651 m)  Wt 85.458 kg (188 lb 6.4 oz)  BMI 31.35 kg/m2  LMP 12/12/2011   Head: NCAT  Neck: supple with FROM  Lgs: CTA  CV: RRR  ABD: Gravid , NT  No CVAT  Ext: Neg c/c/e, SCDs intact  Pelvic: Cerclage intact. No dilatation, Presenting part OOP,  Pessary in proper location Neuro: non focal  Skin: intact   FHT 155-165   Sono 8/20 c/w funnelling down to cerclage.  Cerclage intact. Pessary not seen. Fetus AGA and nl AFI.  A:  23 5/7 week IUP  Cervical Insufficiency s/p Rescue cerclage - Pessary placed 8/15  GBS neg and Urine CS neg with no s/s of Chorioamnionits  GERD- symptomatic   P;  Sono to ck CL weekly NICU consult done- will resuscitate per NICU status  Remain inpatient status (MFM agrees) Continue Protonix  BMZ complete

## 2012-06-05 NOTE — Progress Notes (Signed)
Penny [name]  was seen today for an ultrasound appointment.  See full report in [facility].  Alpha Gula, MD  Single IUP at 23 3/7 weeks Normal fetal anatomic survey; however, limited views of the fetal heart and face were obtained Normal amniotic fluid volume  TVUS - no measurable cervix.  V-shaped funneling noted with dynamic changes. The cerclage stitch appears intact.  Continue inpatient observation/ bedrest.

## 2012-06-06 NOTE — Progress Notes (Signed)
06/06/12 1500  Clinical Encounter Type  Visited With Patient and family together (Husband Oriyomi)  Visit Type Follow-up;Spiritual support;Social support  Spiritual Encounters  Spiritual Needs Emotional;Prayer    Penny [name] and husband Penny [name] are very pleased and relieved to reach the latest milestone.  Their spirits are bright, and they continue to welcome pastoral support.  We shared a prayer of gratitude and a merry check-in.  Spiritual Care will continue to follow for support.  [address] [place], [place] [phone]

## 2012-06-06 NOTE — Progress Notes (Signed)
Patient ID: Penny [name], female   DOB: 05-09-1976, 36 y.o.   MRN: [id number] 23 6/7 weeks  Cervical Insufficiency   S:  No bleeding.  No brownish/yellowish dc  Good FM, No contractions.  No CP or SOB.  No LOF noted.  Occ low back pain now improved No discomfort with pessary   O:  BP 139/72  Pulse 74  Temp 98.1 F (36.7 C) (Oral)  Resp 18  Ht 5\' 5"  (1.651 m)  Wt 85.458 kg (188 lb 6.4 oz)  BMI 31.35 kg/m2  LMP 12/12/2011  NCAT Neck: supple with FROM  Lgs: CTA  CV: RRR  ABD: Gravid , NT  No CVAT  Ext: Neg c/c/e, SCDs intact  Pelvic: deferred Neuro: non focal  Skin: intact   FHT 155-160  Sono 8/20 c/w funnelling down to cerclage.  Cerclage intact. Pessary not seen.  Fetus AGA and nl AFI.   A:  23 6/7 week IUP  Cervical Insufficiency s/p Rescue cerclage - Pessary placed 8/15  GBS neg and Urine CS neg with no s/s of Chorioamnionits  GERD- symptomatic  Constipation - stable  P;  Sono to ck CL weekly  NICU consult done- will resuscitate per NICU status  Remain inpatient status (MFM agrees)  Continue Protonix  BMZ complete

## 2012-06-07 NOTE — Progress Notes (Signed)
UR Chart review completed.  

## 2012-06-07 NOTE — Progress Notes (Signed)
Patient ID: Penny [name], female   DOB: [birth year], 36 y.o.   MRN: [id number] 24 0/7 weeks  Cervical Insufficiency   S:  No bleeding.  No brownish/yellowish dc  Good FM, No contractions.  No CP or SOB. Tolerating intermittent Trendelenberg. Doing PT recommended exercises No LOF noted.  Occ low back pain now improved  No discomfort with pessary   O:  BP 141/92  Pulse 88  Temp 98.7 F (37.1 C) (Oral)  Resp 18  Ht 5\' 5"  (1.651 m)  Wt 85.458 kg (188 lb 6.4 oz)  BMI 31.35 kg/m2  LMP 12/12/2011   NCAT  Neck: supple with FROM  Lgs: CTA , No WRR CV: RRR , No MRG ABD: Gravid , NT  No CVAT  Ext: Neg c/c/e, SCDs intact  Pelvic: deferred  Neuro: non focal  Skin: intact   FHT 155-160   Sono 8/20 c/w funnelling down to cerclage.  Cerclage intact. Pessary not seen.  Fetus AGA and nl AFI.   A:  24 0/7 week IUP  Cervical Insufficiency s/p Rescue cerclage - Pessary placed 8/15  GBS neg and Urine CS neg with no s/s of Chorioamnionitis  GERD- symptomatic  Constipation - stable   P;  Sono to ck CL weekly  NICU consult done- will resuscitate per NICU status  Remain inpatient status (MFM agrees)  Continue Protonix  BMZ complete  Continue Vaginal Prometrium

## 2012-06-08 NOTE — Progress Notes (Signed)
Patient ID: Penny [name], female   DOB: 04/28/1976, 36 y.o.   MRN: [id number] 24 1/7 weeks  Cervical Insufficiency   S:  No bleeding.  No brownish/yellowish dc  Good FM, No contractions.  Nl HA or epigastric pain No CP or SOB. Tolerating intermittent Trendelenberg.  Doing PT recommended exercises  No LOF noted.  Occ low back pain now improved  No discomfort with pessary   O:  BP 152/90  Pulse 83  Temp 98.7 F (37.1 C) (Oral)  Resp 20  Ht 5\' 5"  (1.651 m)  Wt 85.458 kg (188 lb 6.4 oz)  BMI 31.35 kg/m2  LMP 12/12/2011  BP now 136/69  NCAT  Neck: supple with FROM  Lgs: CTA , No WRR  CV: RRR , No MRG  ABD: Gravid , NT  No CVAT  Ext: Neg c/c/e, SCDs intact , DTRs 2+ Pelvic: deferred  Neuro: non focal  Skin: intact   FHT 150-160   Sono 8/20 c/w funnelling down to cerclage.  Cerclage intact. Pessary not seen.  Fetus AGA and nl AFI.   A:  24 0/7 week IUP  Cervical Insufficiency s/p Rescue cerclage - Pessary placed 8/15  GBS neg and Urine CS neg with no s/s of Chorioamnionitis  GERD- symptomatic  Constipation - stable  Elevated BP - no s/s PEC  P;  Sono to ck CL weekly  NICU consult done- will resuscitate per NICU status  Remain inpatient status (MFM agrees)  Continue Protonix  BMZ complete  Continue Vaginal Prometrium  Watch BP closely

## 2012-06-09 NOTE — Progress Notes (Signed)
Patient ID: Penny [name], female   DOB: Feb 05, 1976, 36 y.o.   MRN: [id number] 24 2/7 weeks  Cervical Insufficiency   S:  No bleeding.  No brownish/yellowish dc  Good FM, No contractions.  Nl HA or epigastric pain  No CP or SOB. Tolerating intermittent Trendelenberg.  Doing PT recommended exercises  No LOF noted.  Occ low back pain now improved  No discomfort with pessary   O:  BP 121/72  Pulse 92  Temp 98.4 F (36.9 C) (Oral)  Resp 20  Ht 5\' 5"  (1.651 m)  Wt 85.458 kg (188 lb 6.4 oz)  BMI 31.35 kg/m2  LMP 12/12/2011  NCAT  Neck: supple with FROM  Lgs: CTA , No WRR  CV: RRR , No MRG  ABD: Gravid , NT  No CVAT  Ext: Neg c/c/e, SCDs intact , DTRs 2+  Pelvic: deferred  Neuro: non focal  Skin: intact   FHT 150-165  Sono 8/20 c/w funnelling down to cerclage.  Cerclage intact. Pessary not seen.  Fetus AGA and nl AFI.   A:  24 1/7 week IUP  Cervical Insufficiency s/p Rescue cerclage - Pessary placed 8/15  GBS neg and Urine CS neg with no s/s of Chorioamnionitis  GERD- symptomatic  Constipation - stable  Occ Elevated BP - no s/s PEC   P;  Sono to ck CL weekly  NICU consult done- will resuscitate per NICU status  Remain inpatient status (MFM agrees)  Continue Protonix  BMZ complete  Continue Vaginal Prometrium Watch BP closely

## 2012-06-10 MED ORDER — CYCLOBENZAPRINE HCL 10 MG PO TABS
10.0000 mg | ORAL_TABLET | Freq: Every day | ORAL | Status: DC
  Administered 2012-06-10 – 2012-06-11 (×2): 10 mg via ORAL
  Filled 2012-06-10 (×2): qty 1

## 2012-06-10 NOTE — Progress Notes (Signed)
Patient ID: Penny [name], female   DOB: 1976/07/08, 36 y.o.   MRN: [id number] 24 3/7 weeks  Cervical Insufficiency   S:  No bleeding.  No brownish/yellowish dc  Good FM, No contractions.  Nl HA or epigastric pain  No CP or SOB. Tolerating intermittent Trendelenberg.  Doing PT recommended exercises  No LOF noted.  Occ low back pain   No discomfort with pessary   O:  BP 130/71  Pulse 93  Temp 98.5 F (36.9 C) (Oral)  Resp 18  Ht 5\' 5"  (1.651 m)  Wt 85.458 kg (188 lb 6.4 oz)  BMI 31.35 kg/m2  LMP 12/12/2011  NCAT  Neck: supple with FROM  Lgs: CTA , No WRR  CV: RRR , No MRG  ABD: Gravid , NT  No CVAT  Ext: Neg c/c/e, SCDs intact , DTRs 2+  Pelvic: deferred  Neuro: non focal  Skin: intact   FHT 150-160 Sono 8/20 c/w funnelling down to cerclage.  Cerclage intact. Pessary not seen.  Fetus AGA and nl AFI on sono 8/20  A:  24 3/7 week IUP  Cervical Insufficiency s/p Rescue cerclage - Pessary placed 8/15  GBS neg and Urine CS neg with no s/s of Chorioamnionitis  GERD- symptomatic  Constipation - stable  Occ Elevated BP - no s/s PEC   P;  Sono to ck CL weekly  NICU consult done- will resuscitate per NICU status  Remain inpatient status (MFM agrees)  Continue Protonix  BMZ complete  Continue Vaginal Prometrium  Watch BP closely

## 2012-06-11 LAB — URINE MICROSCOPIC-ADD ON

## 2012-06-11 LAB — URINALYSIS, ROUTINE W REFLEX MICROSCOPIC
Bilirubin Urine: NEGATIVE
Ketones, ur: NEGATIVE mg/dL
Nitrite: NEGATIVE
Protein, ur: NEGATIVE mg/dL
pH: 7 (ref 5.0–8.0)

## 2012-06-11 LAB — CBC WITH DIFFERENTIAL/PLATELET
Basophils Absolute: 0 K/uL (ref 0.0–0.1)
Basophils Relative: 0 % (ref 0–1)
Eosinophils Absolute: 0.1 K/uL (ref 0.0–0.7)
Eosinophils Relative: 1 % (ref 0–5)
HCT: 36.8 % (ref 36.0–46.0)
Hemoglobin: 12.4 g/dL (ref 12.0–15.0)
Lymphocytes Relative: 11 % — ABNORMAL LOW (ref 12–46)
Lymphs Abs: 1.6 K/uL (ref 0.7–4.0)
MCH: 29.7 pg (ref 26.0–34.0)
MCHC: 33.7 g/dL (ref 30.0–36.0)
MCV: 88 fL (ref 78.0–100.0)
Monocytes Absolute: 0.9 K/uL (ref 0.1–1.0)
Monocytes Relative: 6 % (ref 3–12)
Neutro Abs: 11.6 K/uL — ABNORMAL HIGH (ref 1.7–7.7)
Neutrophils Relative %: 82 % — ABNORMAL HIGH (ref 43–77)
Platelets: 221 K/uL (ref 150–400)
RBC: 4.18 MIL/uL (ref 3.87–5.11)
RDW: 13.4 % (ref 11.5–15.5)
WBC: 14.2 K/uL — ABNORMAL HIGH (ref 4.0–10.5)

## 2012-06-11 MED ORDER — CYCLOBENZAPRINE HCL 10 MG PO TABS
10.0000 mg | ORAL_TABLET | Freq: Three times a day (TID) | ORAL | Status: DC | PRN
  Administered 2012-06-12: 10 mg via ORAL
  Filled 2012-06-11 (×2): qty 1

## 2012-06-11 MED ORDER — CYCLOBENZAPRINE HCL 10 MG PO TABS
10.0000 mg | ORAL_TABLET | Freq: Once | ORAL | Status: AC
  Administered 2012-06-11: 10 mg via ORAL
  Filled 2012-06-11: qty 1

## 2012-06-11 MED ORDER — NITROFURANTOIN MONOHYD MACRO 100 MG PO CAPS
100.0000 mg | ORAL_CAPSULE | Freq: Two times a day (BID) | ORAL | Status: DC
  Administered 2012-06-11 – 2012-06-12 (×3): 100 mg via ORAL
  Filled 2012-06-11 (×5): qty 1

## 2012-06-11 NOTE — Progress Notes (Signed)
Ur chart review completed.  

## 2012-06-11 NOTE — Progress Notes (Signed)
Patient ID: Penny [name], female   DOB: Oct 10, 1976, 36 y.o.   MRN: [id number] 24 4/7 weeks  Cervical Insufficiency   S:  No bleeding. No LOF. No brownish/yellowish dc  Good FM, No contractions.  Nl HA or epigastric pain  No CP or SOB. Had episodic lower abdominal cramping and back pain last night Took Tylenol and Flexeril with relief.  O:  BP 147/74  Pulse 120  Temp 97.5 F (36.4 C) (Oral)  Resp 20  Ht 5\' 5"  (1.651 m)  Wt 85.458 kg (188 lb 6.4 oz)  BMI 31.35 kg/m2  LMP 12/12/2011  NCAT  Neck: supple with FROM  Lgs: CTA , No WRR  CV: RRR , No MRG  ABD: Gravid , NT, No fundal tenderness Area of discomfort suprapubic  No CVAT  Ext: Neg c/c/e, SCDs intact , DTRs 2+  Pelvic: deferred  Neuro: non focal  Skin: intact   FHT 150-160 reassuring on monitoring last night Occ contractions noted Sono 8/20 c/w funnelling down to cerclage.  Cerclage intact. Pessary not seen.  Fetus AGA and nl AFI on sono 8/20 UA with pos leuks.  A:  24 4/7 week IUP  Cervical Insufficiency s/p Rescue cerclage - Pessary placed 8/15  GBS neg and Urine CS neg with no s/s of Chorioamnionitis  GERD- symptomatic  Constipation - stable  Occ Elevated BP - no s/s PEC  Lower abdominal cramping with leuks on UA. Improved this am  P;  Sono to ck CL weekly  NICU consult done- will resuscitate per NICU status  Remain inpatient status (MFM agrees)  Continue Protonix  BMZ complete  Continue Vaginal Prometrium  Watch BP closely  Flexeril prn Macrobid bid x 7d

## 2012-06-11 NOTE — Progress Notes (Signed)
Patient ID: Penny [name], female   DOB: 1976/03/06, 36 y.o.   MRN: [id number] Had pinkish dc and low back pain today. BP 143/75  Pulse 108  Temp 98.4 F (36.9 C) (Oral)  Resp 18  Ht 5\' 5"  (1.651 m)  Wt 85.458 kg (188 lb 6.4 oz)  BMI 31.35 kg/m2  LMP 12/12/2011  No contractions on TOCO. FHR reassuring for 24+ weeks. VE: cl/ cerclage intact/ no bulging or prolapsed membranes         Well developed LUS. Pessary intact . 24+ wks with Cervical insufficiency Continue present plan.

## 2012-06-12 ENCOUNTER — Encounter (HOSPITAL_COMMUNITY)
Admission: AD | Disposition: A | Discharge status: Home / Self Care | Payer: Self-pay | Source: Ambulatory Visit | Attending: Obstetrics and Gynecology

## 2012-06-12 ENCOUNTER — Encounter (HOSPITAL_COMMUNITY): Payer: Self-pay | Admitting: *Deleted

## 2012-06-12 ENCOUNTER — Encounter (HOSPITAL_COMMUNITY): Payer: Self-pay | Admitting: Anesthesiology

## 2012-06-12 ENCOUNTER — Inpatient Hospital Stay (HOSPITAL_COMMUNITY): Payer: 59

## 2012-06-12 ENCOUNTER — Inpatient Hospital Stay (HOSPITAL_COMMUNITY): Payer: 59 | Admitting: Anesthesiology

## 2012-06-12 LAB — AMNISURE RUPTURE OF MEMBRANE (ROM) NOT AT ARMC: Amnisure ROM: NEGATIVE

## 2012-06-12 LAB — CBC
Hemoglobin: 13.2 g/dL (ref 12.0–15.0)
MCHC: 34.5 g/dL (ref 30.0–36.0)
RBC: 4.4 MIL/uL (ref 3.87–5.11)

## 2012-06-12 LAB — URINE CULTURE: Colony Count: 25000

## 2012-06-12 SURGERY — Surgical Case
Anesthesia: Spinal | Site: Abdomen | Wound class: Clean Contaminated

## 2012-06-12 MED ORDER — MORPHINE SULFATE (PF) 0.5 MG/ML IJ SOLN
INTRAMUSCULAR | Status: DC | PRN
  Administered 2012-06-12: 4.85 mg via INTRAVENOUS
  Administered 2012-06-12: .15 mg via INTRATHECAL

## 2012-06-12 MED ORDER — BUPIVACAINE HCL (PF) 0.25 % IJ SOLN
INTRAMUSCULAR | Status: DC | PRN
  Administered 2012-06-12: 10 mL

## 2012-06-12 MED ORDER — IBUPROFEN 600 MG PO TABS
600.0000 mg | ORAL_TABLET | Freq: Four times a day (QID) | ORAL | Status: DC
  Administered 2012-06-12: 600 mg via ORAL
  Filled 2012-06-12: qty 1

## 2012-06-12 MED ORDER — MEPERIDINE HCL 25 MG/ML IJ SOLN
6.2500 mg | INTRAMUSCULAR | Status: DC | PRN
Start: 2012-06-12 — End: 2012-06-12

## 2012-06-12 MED ORDER — BUPIVACAINE HCL (PF) 0.25 % IJ SOLN
INTRAMUSCULAR | Status: AC
  Filled 2012-06-12: qty 30

## 2012-06-12 MED ORDER — KETOROLAC TROMETHAMINE 30 MG/ML IJ SOLN: 30.0000 mg | Freq: Four times a day (QID) | INTRAMUSCULAR | Status: DC | PRN

## 2012-06-12 MED ORDER — FENTANYL CITRATE 0.05 MG/ML IJ SOLN
INTRAMUSCULAR | Status: AC
  Filled 2012-06-12: qty 2

## 2012-06-12 MED ORDER — FENTANYL CITRATE 0.05 MG/ML IJ SOLN
INTRAMUSCULAR | Status: DC | PRN
  Administered 2012-06-12: 25 ug via INTRATHECAL
  Administered 2012-06-12: 75 ug via INTRAVENOUS

## 2012-06-12 MED ORDER — MORPHINE SULFATE 0.5 MG/ML IJ SOLN
INTRAMUSCULAR | Status: AC
  Filled 2012-06-12: qty 10

## 2012-06-12 MED ORDER — SCOPOLAMINE 1 MG/3DAYS TD PT72
MEDICATED_PATCH | TRANSDERMAL | Status: AC
  Administered 2012-06-12: 1.5 mg via TRANSDERMAL
  Filled 2012-06-12: qty 1

## 2012-06-12 MED ORDER — CEFAZOLIN SODIUM 1-5 GM-% IV SOLN
INTRAVENOUS | Status: DC | PRN
  Administered 2012-06-12: 2 g via INTRAVENOUS

## 2012-06-12 MED ORDER — CEFAZOLIN SODIUM-DEXTROSE 2-3 GM-% IV SOLR
INTRAVENOUS | Status: AC
  Filled 2012-06-12: qty 50

## 2012-06-12 MED ORDER — ONDANSETRON HCL 4 MG/2ML IJ SOLN
INTRAMUSCULAR | Status: AC
  Filled 2012-06-12: qty 2

## 2012-06-12 MED ORDER — SCOPOLAMINE 1 MG/3DAYS TD PT72
1.0000 | MEDICATED_PATCH | Freq: Once | TRANSDERMAL | Status: DC
  Administered 2012-06-12: 1.5 mg via TRANSDERMAL

## 2012-06-12 MED ORDER — FENTANYL CITRATE 0.05 MG/ML IJ SOLN
25.0000 ug | INTRAMUSCULAR | Status: DC | PRN
  Administered 2012-06-12: 50 ug via INTRAVENOUS

## 2012-06-12 MED ORDER — CITRIC ACID-SODIUM CITRATE 334-500 MG/5ML PO SOLN
ORAL | Status: AC
  Administered 2012-06-12: 21:00:00
  Filled 2012-06-12: qty 15

## 2012-06-12 MED ORDER — LACTATED RINGERS IV SOLN
INTRAVENOUS | Status: DC | PRN
  Administered 2012-06-12: 21:00:00 via INTRAVENOUS

## 2012-06-12 MED ORDER — OXYTOCIN 10 UNIT/ML IJ SOLN
INTRAMUSCULAR | Status: AC
  Filled 2012-06-12: qty 4

## 2012-06-12 MED ORDER — KETOROLAC TROMETHAMINE 30 MG/ML IJ SOLN
30.0000 mg | Freq: Four times a day (QID) | INTRAMUSCULAR | Status: DC | PRN
  Administered 2012-06-12: 30 mg via INTRAMUSCULAR

## 2012-06-12 MED ORDER — ONDANSETRON HCL 4 MG/2ML IJ SOLN
INTRAMUSCULAR | Status: DC | PRN
  Administered 2012-06-12: 4 mg via INTRAVENOUS

## 2012-06-12 MED ORDER — KETOROLAC TROMETHAMINE 30 MG/ML IJ SOLN
INTRAMUSCULAR | Status: AC
  Administered 2012-06-12: 30 mg via INTRAMUSCULAR
  Filled 2012-06-12: qty 1

## 2012-06-12 MED ORDER — NALBUPHINE SYRINGE 5 MG/0.5 ML
INJECTION | INTRAMUSCULAR | Status: AC
  Administered 2012-06-12: 10 mg via SUBCUTANEOUS
  Filled 2012-06-12: qty 1

## 2012-06-12 MED ORDER — NALBUPHINE HCL 10 MG/ML IJ SOLN
5.0000 mg | INTRAMUSCULAR | Status: DC | PRN
  Filled 2012-06-12: qty 1

## 2012-06-12 MED ORDER — NALBUPHINE SYRINGE 5 MG/0.5 ML
5.0000 mg | INJECTION | INTRAMUSCULAR | Status: DC | PRN
  Administered 2012-06-12: 10 mg via SUBCUTANEOUS
  Filled 2012-06-12: qty 1

## 2012-06-12 MED ORDER — OXYTOCIN 10 UNIT/ML IJ SOLN
40.0000 [IU] | INTRAVENOUS | Status: DC | PRN
  Administered 2012-06-12: 40 [IU] via INTRAVENOUS

## 2012-06-12 MED ORDER — FENTANYL CITRATE 0.05 MG/ML IJ SOLN
INTRAMUSCULAR | Status: AC
  Administered 2012-06-12: 50 ug via INTRAVENOUS
  Filled 2012-06-12: qty 2

## 2012-06-12 MED ORDER — BUPIVACAINE IN DEXTROSE 0.75-8.25 % IT SOLN
INTRATHECAL | Status: DC | PRN
  Administered 2012-06-12: 13 mg via INTRATHECAL

## 2012-06-12 SURGICAL SUPPLY — 32 items
CLOTH BEACON ORANGE TIMEOUT ST (SAFETY) ×2 IMPLANT
CONTAINER PREFILL 10% NBF 15ML (MISCELLANEOUS) IMPLANT
DRESSING TELFA 8X3 (GAUZE/BANDAGES/DRESSINGS) IMPLANT
DRSG COVADERM 4X10 (GAUZE/BANDAGES/DRESSINGS) ×2 IMPLANT
ELECT REM PT RETURN 9FT ADLT (ELECTROSURGICAL) ×2
ELECTRODE REM PT RTRN 9FT ADLT (ELECTROSURGICAL) ×1 IMPLANT
EXTRACTOR VACUUM M CUP 4 TUBE (SUCTIONS) IMPLANT
GAUZE SPONGE 4X4 12PLY STRL LF (GAUZE/BANDAGES/DRESSINGS) ×4 IMPLANT
GLOVE BIO SURGEON STRL SZ7.5 (GLOVE) ×4 IMPLANT
GOWN PREVENTION PLUS LG XLONG (DISPOSABLE) ×4 IMPLANT
GOWN PREVENTION PLUS XLARGE (GOWN DISPOSABLE) ×2 IMPLANT
KIT ABG SYR 3ML LUER SLIP (SYRINGE) IMPLANT
NEEDLE HYPO 25X1 1.5 SAFETY (NEEDLE) ×2 IMPLANT
NEEDLE HYPO 25X5/8 SAFETYGLIDE (NEEDLE) IMPLANT
NS IRRIG 1000ML POUR BTL (IV SOLUTION) ×2 IMPLANT
PACK C SECTION WH (CUSTOM PROCEDURE TRAY) ×2 IMPLANT
PAD ABD 7.5X8 STRL (GAUZE/BANDAGES/DRESSINGS) IMPLANT
PAD OB MATERNITY 4.3X12.25 (PERSONAL CARE ITEMS) IMPLANT
SLEEVE SCD COMPRESS KNEE MED (MISCELLANEOUS) IMPLANT
STAPLER VISISTAT 35W (STAPLE) ×2 IMPLANT
SUT MNCRL 0 VIOLET CTX 36 (SUTURE) ×2 IMPLANT
SUT MON AB 2-0 CT1 27 (SUTURE) ×6 IMPLANT
SUT MON AB-0 CT1 36 (SUTURE) ×4 IMPLANT
SUT MONOCRYL 0 CTX 36 (SUTURE) ×2
SUT PLAIN 0 NONE (SUTURE) IMPLANT
SUT PLAIN 2 0 XLH (SUTURE) IMPLANT
SUT VIC AB 2-0 CT1 27 (SUTURE) ×3
SUT VIC AB 2-0 CT1 TAPERPNT 27 (SUTURE) ×3 IMPLANT
SYR CONTROL 10ML LL (SYRINGE) ×2 IMPLANT
TOWEL OR 17X24 6PK STRL BLUE (TOWEL DISPOSABLE) ×4 IMPLANT
TRAY FOLEY CATH 14FR (SET/KITS/TRAYS/PACK) IMPLANT
WATER STERILE IRR 1000ML POUR (IV SOLUTION) ×2 IMPLANT

## 2012-06-12 NOTE — Anesthesia Preprocedure Evaluation (Signed)
Anesthesia Evaluation  Patient identified by MRN, date of birth, ID band Patient awake    Reviewed: Allergy & Precautions, H&P , Patient's Chart, lab work & pertinent test results  Airway Mallampati: III TM Distance: >3 FB Neck ROM: full    Dental No notable dental hx. (+) Teeth Intact   Pulmonary neg pulmonary ROS,  breath sounds clear to auscultation  Pulmonary exam normal       Cardiovascular negative cardio ROS  Rhythm:regular Rate:Normal     Neuro/Psych negative neurological ROS  negative psych ROS   GI/Hepatic negative GI ROS, Neg liver ROS,   Endo/Other  negative endocrine ROS  Renal/GU negative Renal ROS  negative genitourinary   Musculoskeletal   Abdominal Normal abdominal exam  (+)   Peds  Hematology negative hematology ROS (+)   Anesthesia Other Findings   Reproductive/Obstetrics (+) Pregnancy                           Anesthesia Physical Anesthesia Plan  ASA: II and Emergent  Anesthesia Plan: Spinal   Post-op Pain Management:    Induction:   Airway Management Planned:   Additional Equipment:   Intra-op Plan:   Post-operative Plan:   Informed Consent: I have reviewed the patients History and Physical, chart, labs and discussed the procedure including the risks, benefits and alternatives for the proposed anesthesia with the patient or authorized representative who has indicated his/her understanding and acceptance.     Plan Discussed with: Anesthesiologist, CRNA and Surgeon  Anesthesia Plan Comments:         Anesthesia Quick Evaluation

## 2012-06-12 NOTE — Anesthesia Postprocedure Evaluation (Signed)
  Anesthesia Post-op Note  Patient: Penny [name]  Procedure(s) Performed: Procedure(s) (LRB): CESAREAN SECTION (N/A)  Patient Location: PACU  Anesthesia Type: Spinal  Level of Consciousness: awake, alert  and oriented  Airway and Oxygen Therapy: Patient Spontanous Breathing  Post-op Pain: none  Post-op Assessment: Post-op Vital signs reviewed, Patient's Cardiovascular Status Stable, Respiratory Function Stable, Patent Airway, No signs of Nausea or vomiting, Pain level controlled, No headache and No backache  Post-op Vital Signs: Reviewed and stable  Complications: No apparent anesthesia complications

## 2012-06-12 NOTE — Progress Notes (Signed)
Patient ID: Norine Reddington, female   DOB: 07/17/1976, 36 y.o.   MRN: [id number] 24 5/7 weeks  Cervical Insufficiency with Rescue cerclage  S:  No bleeding. No LOF. No brownish/yellowish dc  Occ pinkish dc- recurrent today. Good FM, No contractions.  Nl HA or epigastric pain  No CP or SOB. Had episodic lower abdominal cramping and back pain last night Took Tylenol and Flexeril with some relief.  O:  BP 119/64  Pulse 103  Temp 98.4 F (36.9 C) (Oral)  Resp 20  Ht 5\' 5"  (1.651 m)  Wt 85.458 kg (188 lb 6.4 oz)  BMI 31.35 kg/m2  LMP 12/12/2011  NCAT  Neck: supple with FROM  Lgs: CTA , No WRR  CV: RRR , No MRG  ABD: Gravid , NT, No fundal tenderness Area of discomfort suprapubic  No CVAT  Ext: Neg c/c/e, SCDs intact , DTRs 2+  Pelvic: deferred  Neuro: non focal  Skin: intact   FHT 155-165 reassuring , occ variable decels noted Occ contractions noted Sono 8/20 c/w funnelling down to cerclage.  Cerclage intact. Pessary not seen.  Fetus AGA and nl AFI on sono 8/20 UA with pos leuks. CBC    Component Value Date/Time   WBC 14.2* 06/11/2012 0915   RBC 4.18 06/11/2012 0915   HGB 12.4 06/11/2012 0915   HGB 13.2 02/24/2012   HCT 36.8 06/11/2012 0915   HCT 38 02/24/2012   PLT 221 06/11/2012 0915   MCV 88.0 06/11/2012 0915   MCH 29.7 06/11/2012 0915   MCHC 33.7 06/11/2012 0915   RDW 13.4 06/11/2012 0915   LYMPHSABS 1.6 06/11/2012 0915   MONOABS 0.9 06/11/2012 0915   EOSABS 0.1 06/11/2012 0915   BASOSABS 0.0 06/11/2012 0915      A:  24 5/7 week IUP  Cervical Insufficiency s/p Rescue cerclage - Pessary placed 8/15  No s/s of Chorioamnionitis  GERD- symptomatic but improved on Protonix Constipation - stable  Occ Elevated BP - no s/s PEC  Low back pain- ? Muscle spasm  P;  Sono to ck CL today NICU consult done- will resuscitate per NICU status  Remain inpatient status (MFM agrees)  Continue Protonix  BMZ complete  Continue Vaginal Prometrium  Watch BP closely  Flexeril  prn Macrobid bid x 7d Have PT see pt to reevaluate back pain. Will continue with Toco x one hr q shift

## 2012-06-12 NOTE — Op Note (Signed)
Cesarean Section Procedure Note  Indications: abruptio placenta and SROM and PTL   Pre-operative Diagnosis: 24 week 5 day pregnancy.  Post-operative Diagnosis: same  Surgeon: Lenoard Aden   Assistants: Seymour Bars  Anesthesia: Local anesthesia 0.25.% bupivacaine and Spinal anesthesia  ASA Class: 1  Procedure Details  The patient was seen in the Holding Room. The risks, benefits, complications, treatment options, and expected outcomes were discussed with the patient.  The patient concurred with the proposed plan, giving informed consent. The risks of anesthesia, infection, bleeding and possible injury to other organs discussed. Injury to bowel, bladder, or ureter with possible need for repair discussed. Possible need for transfusion with secondary risks of hepatitis or HIV acquisition discussed. Post operative complications to include but not limited to DVT, PE and Pneumonia noted. The site of surgery properly noted/marked. The patient was taken to Operating Room # 1, identified as Penny [name] and the procedure verified as C-Section Delivery. A Time Out was held and the above information confirmed.  After induction of anesthesia, the patient was draped and prepped in the usual sterile manner. A Pfannenstiel incision was made and carried down through the subcutaneous tissue to the fascia. Fascial incision was made and extended transversely using Mayo scissors. The fascia was separated from the underlying rectus tissue superiorly and inferiorly. The peritoneum was identified and entered. Peritoneal incision was extended longitudinally. The utero-vesical peritoneal reflection was incised transversely and the bladder flap was bluntly freed from the lower uterine segment. A vertical classical incision was made. Delivered from OA presentation was a  female with Apgar scores of 6 at one minute and 8 at five minutes. Bulb suctioning gently performed. Neonatal team in attendance.After the umbilical cord  was clamped and cut cord blood was obtained for evaluation. The placenta was removed intact and appeared normal. The uterus was curetted with a dry lap pack. Good hemostasis was noted.The uterine outline, tubes and ovaries appeared normal. The uterine incision was closed with running locked sutures of 0 Monocryl x 2 layers. 2-0 monocyll baseball stitch to close serosa.Hemostasis was observed. Lavage was carried out until clear.The parietal peritoneum was closed with a running 2-0 Monocryl suture. The fascia was then reapproximated with running sutures of 0 Monocryl. The skin was reapproximated with staples.  Instrument, sponge, and needle counts were correct prior the abdominal closure and at the conclusion of the case.   Findings: Cord Ph 7.31 Abruptio 80%  Estimated Blood Loss:  500          Drains: foley                 Specimens: placenta                 Complications:  None; patient tolerated the procedure well.         Disposition: PACU - hemodynamically stable.         Condition: stable  Attending Attestation: I performed the procedure.

## 2012-06-12 NOTE — Progress Notes (Signed)
Penny [name]  was seen today for an ultrasound appointment.  See full report in [facility].  Alpha Gula, MD  Single IUP at 23 4/7 weeks Normal amniotic fluid volume  TVUS - no measurable cervix.  The cerclage stitch appears intact. The V-shaped funneling that was previously noted has improved.   Continue in patient observation Reassess cervix in one week or sooner with change in clinical findings

## 2012-06-12 NOTE — Progress Notes (Signed)
Discussed with MD the chage to watery discharge, pink tinged.  Orders received to obtain a Amnisure

## 2012-06-12 NOTE — Transfer of Care (Signed)
Immediate Anesthesia Transfer of Care Note  Patient: Penny [name]  Procedure(s) Performed: Procedure(s) (LRB): CESAREAN SECTION (N/A)  Patient Location: PACU  Anesthesia Type: Spinal  Level of Consciousness: awake, alert , oriented and patient cooperative  Airway & Oxygen Therapy: Patient Spontanous Breathing  Post-op Assessment: Report given to PACU RN and Post -op Vital signs reviewed and stable  Post vital signs: Reviewed and stable  Complications: No apparent anesthesia complications

## 2012-06-12 NOTE — Progress Notes (Signed)
MD notified that pt having increasing amt of vaginal discharge, orange in color.  No bright red blood noted.  Discussed with MD ultrasound from this morning looks a little better than the last and MD stating that the pessary may cause increase in vaginal discharge as well.  Pt reassured.

## 2012-06-12 NOTE — Progress Notes (Signed)
Dr Billy Coast removed Pessary, stated he wants stat c/s due to pt laboring.

## 2012-06-12 NOTE — Progress Notes (Signed)
Patient ID: Penny [name], female   DOB: [birth year], 36 y.o.   MRN: [id number] Persistent back pain unrelieved with Tylenol or Motrin. Now acutely with onset of vaginal bleeding and gush of fluid.  BP 125/70  Pulse 100  Temp 98.4 F (36.9 C) (Oral)  Resp 18  Ht 5\' 5"  (1.651 m)  Wt 85.458 kg (188 lb 6.4 oz)  BMI 31.35 kg/m2  LMP 12/12/2011   No contractions noted on Toco. FHT 160-165. VE: Gross ROM BRB, Cerclage pulling through at 1900 Pessary removed. No vaginal lacerations noted.  Clinical impression: SROM, PTL and likely abruption.  Will proceed with emergent cesarean section. Risks of anesthesia, infection and bleeding discussed with patient. Possible need for transfusion noted. Consent signed. Anesthesia, OR and NICU notified. CBC, T&S and Coags ordered.

## 2012-06-12 NOTE — Anesthesia Procedure Notes (Signed)
Spinal  Patient location during procedure: OR Start time: 06/12/2012 9:00 PM Staffing Anesthesiologist: Braylynn Ghan A. Performed by: anesthesiologist  Preanesthetic Checklist Completed: patient identified, site marked, surgical consent, pre-op evaluation, timeout performed, IV checked, risks and benefits discussed and monitors and equipment checked Spinal Block Patient position: right lateral decubitus Prep: site prepped and draped and DuraPrep Patient monitoring: heart rate, cardiac monitor, continuous pulse ox and blood pressure Approach: midline Location: L3-4 Injection technique: single-shot Needle Needle type: Sprotte  Needle gauge: 24 G Needle length: 9 cm Needle insertion depth: 5 cm Assessment Sensory level: T4 Additional Notes Patient tolerated procedure well. Adequate sensory level.

## 2012-06-12 NOTE — Progress Notes (Signed)
MD notified that pt continue to complain of pain that pt describes as starting "inside her lower abd" and radiating to her right lower back.  Discussed with MD her vaginal bleeding and pain.  Orders received.

## 2012-06-13 ENCOUNTER — Encounter (HOSPITAL_COMMUNITY): Payer: Self-pay

## 2012-06-13 LAB — ABO/RH: ABO/RH(D): O POS

## 2012-06-13 LAB — CBC
MCH: 29.7 pg (ref 26.0–34.0)
MCHC: 33.9 g/dL (ref 30.0–36.0)
Platelets: 199 10*3/uL (ref 150–400)
RDW: 13.2 % (ref 11.5–15.5)

## 2012-06-13 LAB — RPR: RPR Ser Ql: NONREACTIVE

## 2012-06-13 LAB — TYPE AND SCREEN
ABO/RH(D): O POS
Antibody Screen: NEGATIVE

## 2012-06-13 MED ORDER — DIBUCAINE 1 % RE OINT: 1.0000 "application " | TOPICAL_OINTMENT | RECTAL | Status: DC | PRN

## 2012-06-13 MED ORDER — DIPHENHYDRAMINE HCL 50 MG/ML IJ SOLN: 12.5000 mg | INTRAMUSCULAR | Status: DC | PRN

## 2012-06-13 MED ORDER — ZOLPIDEM TARTRATE 5 MG PO TABS: 5.0000 mg | ORAL_TABLET | Freq: Every evening | ORAL | Status: DC | PRN

## 2012-06-13 MED ORDER — METHYLERGONOVINE MALEATE 0.2 MG PO TABS: 0.2000 mg | ORAL_TABLET | ORAL | Status: DC | PRN

## 2012-06-13 MED ORDER — IBUPROFEN 600 MG PO TABS
600.0000 mg | ORAL_TABLET | Freq: Four times a day (QID) | ORAL | Status: DC | PRN
  Filled 2012-06-13 (×9): qty 1

## 2012-06-13 MED ORDER — LACTATED RINGERS IV BOLUS (SEPSIS): 500.0000 mL | Freq: Once | INTRAVENOUS | Status: DC

## 2012-06-13 MED ORDER — TETANUS-DIPHTH-ACELL PERTUSSIS 5-2.5-18.5 LF-MCG/0.5 IM SUSP
0.5000 mL | Freq: Once | INTRAMUSCULAR | Status: AC
  Administered 2012-06-17: 0.5 mL via INTRAMUSCULAR
  Filled 2012-06-13: qty 0.5

## 2012-06-13 MED ORDER — LANOLIN HYDROUS EX OINT: 1.0000 "application " | TOPICAL_OINTMENT | CUTANEOUS | Status: DC | PRN

## 2012-06-13 MED ORDER — MENTHOL 3 MG MT LOZG: 1.0000 | LOZENGE | OROMUCOSAL | Status: DC | PRN

## 2012-06-13 MED ORDER — METOCLOPRAMIDE HCL 5 MG/ML IJ SOLN: 10.0000 mg | Freq: Three times a day (TID) | INTRAMUSCULAR | Status: DC | PRN

## 2012-06-13 MED ORDER — DEXTROSE 5 % IV SOLN
2.0000 g | Freq: Three times a day (TID) | INTRAVENOUS | Status: DC
  Administered 2012-06-13 – 2012-06-14 (×3): 2 g via INTRAVENOUS
  Filled 2012-06-13 (×4): qty 2

## 2012-06-13 MED ORDER — ONDANSETRON HCL 4 MG/2ML IJ SOLN: 4.0000 mg | Freq: Three times a day (TID) | INTRAMUSCULAR | Status: DC | PRN

## 2012-06-13 MED ORDER — SODIUM CHLORIDE 0.9 % IJ SOLN: 3.0000 mL | INTRAMUSCULAR | Status: DC | PRN

## 2012-06-13 MED ORDER — SENNOSIDES-DOCUSATE SODIUM 8.6-50 MG PO TABS
2.0000 | ORAL_TABLET | Freq: Every day | ORAL | Status: DC
  Administered 2012-06-13 – 2012-06-16 (×4): 2 via ORAL

## 2012-06-13 MED ORDER — OXYCODONE-ACETAMINOPHEN 5-325 MG PO TABS
1.0000 | ORAL_TABLET | ORAL | Status: DC | PRN
  Administered 2012-06-13 (×2): 2 via ORAL
  Administered 2012-06-14 (×2): 1 via ORAL
  Administered 2012-06-14: 2 via ORAL
  Administered 2012-06-15 (×2): 1 via ORAL
  Filled 2012-06-13 (×4): qty 1
  Filled 2012-06-13 (×2): qty 2
  Filled 2012-06-13 (×2): qty 1

## 2012-06-13 MED ORDER — LACTATED RINGERS IV SOLN
INTRAVENOUS | Status: DC
  Administered 2012-06-13 – 2012-06-14 (×5): via INTRAVENOUS

## 2012-06-13 MED ORDER — DIPHENHYDRAMINE HCL 25 MG PO CAPS: 25.0000 mg | ORAL_CAPSULE | Freq: Four times a day (QID) | ORAL | Status: DC | PRN

## 2012-06-13 MED ORDER — ONDANSETRON HCL 4 MG PO TABS: 4.0000 mg | ORAL_TABLET | ORAL | Status: DC | PRN

## 2012-06-13 MED ORDER — NALOXONE HCL 0.4 MG/ML IJ SOLN: 0.4000 mg | INTRAMUSCULAR | Status: DC | PRN

## 2012-06-13 MED ORDER — WITCH HAZEL-GLYCERIN EX PADS: 1.0000 "application " | MEDICATED_PAD | CUTANEOUS | Status: DC | PRN

## 2012-06-13 MED ORDER — DIPHENHYDRAMINE HCL 50 MG/ML IJ SOLN: 25.0000 mg | INTRAMUSCULAR | Status: DC | PRN

## 2012-06-13 MED ORDER — DIPHENHYDRAMINE HCL 25 MG PO CAPS: 25.0000 mg | ORAL_CAPSULE | ORAL | Status: DC | PRN

## 2012-06-13 MED ORDER — PRENATAL MULTIVITAMIN CH
1.0000 | ORAL_TABLET | Freq: Every day | ORAL | Status: DC
  Administered 2012-06-13 – 2012-06-17 (×5): 1 via ORAL
  Filled 2012-06-13 (×5): qty 1

## 2012-06-13 MED ORDER — IBUPROFEN 600 MG PO TABS
600.0000 mg | ORAL_TABLET | Freq: Four times a day (QID) | ORAL | Status: DC
  Administered 2012-06-13 – 2012-06-17 (×17): 600 mg via ORAL
  Filled 2012-06-13 (×7): qty 1

## 2012-06-13 MED ORDER — SIMETHICONE 80 MG PO CHEW
80.0000 mg | CHEWABLE_TABLET | Freq: Three times a day (TID) | ORAL | Status: DC
  Administered 2012-06-13 – 2012-06-17 (×17): 80 mg via ORAL

## 2012-06-13 MED ORDER — SODIUM CHLORIDE 0.9 % IV SOLN
1.0000 ug/kg/h | INTRAVENOUS | Status: DC | PRN
  Filled 2012-06-13: qty 2.5

## 2012-06-13 MED ORDER — ONDANSETRON HCL 4 MG/2ML IJ SOLN: 4.0000 mg | INTRAMUSCULAR | Status: DC | PRN

## 2012-06-13 MED ORDER — METHYLERGONOVINE MALEATE 0.2 MG/ML IJ SOLN: 0.2000 mg | INTRAMUSCULAR | Status: DC | PRN

## 2012-06-13 MED ORDER — OXYTOCIN 40 UNITS IN LACTATED RINGERS INFUSION - SIMPLE MED: 62.5000 mL/h | INTRAVENOUS | Status: AC

## 2012-06-13 MED ORDER — SIMETHICONE 80 MG PO CHEW: 80.0000 mg | CHEWABLE_TABLET | ORAL | Status: DC | PRN

## 2012-06-13 NOTE — Progress Notes (Signed)
Subjective: POD# 1 Information for the patient's newborn:  Janis, Sol Girl Robyne [[id number]]  female NICU, prematurity, [redacted]w[redacted]d  Reports feeling well. Feeding: breast, started pumping Patient reports tolerating PO.  Breast symptoms: none Pain controlled with Long term anesthetic, comfortable at rest. Denies HA/SOB/C/P/N/V/dizziness. Flatus absent. She reports vaginal bleeding as normal, without clots.  She ambulated to BR once w/ assist, Foley cath just [facility].      Objective:   VS:  Filed Vitals:   06/13/12 0128 06/13/12 0233 06/13/12 0554 06/13/12 1000  BP: 152/88 162/89 137/79 144/78  Pulse: 133 105 133 117  Temp: 100.2 F (37.9 C) 98.1 F (36.7 C) 100.9 F (38.3 C) 98.7 F (37.1 C)  TempSrc: Oral Oral Oral Oral  Resp: 22 20 20 20   Height:      Weight:      SpO2: 94% 96% 96% 96%     Intake/Output Summary (Last 24 hours) at 06/13/12 1023 Last data filed at 06/13/12 1000  Gross per 24 hour  Intake   4510 ml  Output   2050 ml  Net   2460 ml        Basename 06/13/12 0125 06/12/12 2050  WBC 12.5* 13.9*  HGB 11.6* 13.2  HCT 34.2* 38.3  PLT 199 237     Blood type: --/--/O POS, O POS (08/27 2050)  Rubella: Immune (07/17 0000)     Physical Exam:  General: alert, cooperative and no distress CV: Regular rate and rhythm or mild tachy Resp: clear Abdomen: tender to palp, moderate gas distention, +BS upper quads, decreased lower quads. Incision: clean, dry, intact and  dressing in place Uterine Fundus: firm, below umbilicus, tender Lochia: moderate, not malodorous Ext: Homans sign is negative, no sign of DVT and no edema, redness or tenderness in the calves or thighs      Assessment/Plan: 36 y.o.   POD# 1.  s/p Cesarean Delivery.  Indications: abruptio, [redacted]w[redacted]d   After lon inpatient management for incompetent cervix with cerclage in place.              Principal Problem:  *Postpartum care following cesarean delivery (8/27 - [redacted]w[redacted]d abruption) Tachy and intermittently  febrile, Tmax 100.9 at 5 am Malodorous at delivery per surgeon Suspicious for endometritis Cefoxitin 2 gm IV Q 8 hrs Continue strict I&O             Advance diet as tolerated Ambulate Routine post-op care  Consult Dr. Bradd Canary 06/13/2012, 10:23 AM

## 2012-06-13 NOTE — Progress Notes (Signed)
Ur chart review completed.  

## 2012-06-13 NOTE — Addendum Note (Signed)
Addendum  created 06/13/12 0744 by Shanon Payor, CRNA   Modules edited:Notes Section

## 2012-06-13 NOTE — Progress Notes (Signed)
06/13/12 1400  Clinical Encounter Type  Visited With Patient and family together (Husband Penny [name], friend Penny [name] )  Visit Type Follow-up;Spiritual support;Social support  Referral From Nurse (Ante nurse Renee--pt delivered last night!)  Spiritual Encounters  Spiritual Needs Emotional    Visited with Penny [name] and Penny [name] to hear their birth story and offer blessings for baby "Penny [name]" (Penny [name])/Penny [name].  Family is relieved and grateful, receiving support from their community of friends. Per request, will phone Chaplain [name] to share tidings of delivery.  Spiritual Care will continue to follow for spiritual and emotional support.  [address] [place], [place] [phone]

## 2012-06-13 NOTE — Anesthesia Postprocedure Evaluation (Signed)
  Anesthesia Post-op Note  Patient: Penny [name]  Procedure(s) Performed: Procedure(s) (LRB): CESAREAN SECTION (N/A)  Patient Location: [facility]  Anesthesia Type: Spinal  Level of Consciousness: awake, alert  and oriented  Airway and Oxygen Therapy: Patient Spontanous Breathing  Post-op Pain: none  Post-op Assessment: Post-op Vital signs reviewed, Patient's Cardiovascular Status Stable, No headache, No backache, No residual numbness and No residual motor weakness  Post-op Vital Signs: Reviewed and stable  Complications: No apparent anesthesia complications

## 2012-06-14 NOTE — Progress Notes (Signed)
POSTOPERATIVE DAY # 2 S/P cesarean section at 24 weeks   S:         Reports feeling well             Tolerating po intake / no nausea / no vomiting / + flatus since last night/ no BM             Bleeding is light             Pain controlled with motrin and percocet             Up ad lib / ambulatory  Newborn in NICU / desire to breast-feeding     O:  A & O x 3 / ambulatory in room             VS: Blood pressure 127/84, pulse 102, temperature 98.4 F (36.9 C), temperature source Oral, resp. rate 20, height 5\' 5"  (1.651 m), weight 188 lb 6.4 oz (85.458 kg), last menstrual period 12/12/2011, SpO2 97.00%, unknown if currently breastfeeding.  Lungs: Clear and unlabored  Heart: regular rate and rhythm   Abdomen: soft, non-tender, mildly distended, active bowel sounds             Fundus: firm, non-tender, U-2             Dressing intact          Perineum: no edema  Lochia: light  Extremities: trace edema, no calf pain or tenderness  A:        POD # 2 S/P cesarean at 24 week            Possible chorio - risk for endometritis : afebrile x 24 huors  P:        Routine postoperative care             Discontinue Iv Abx today             shower and remove dressing today                         Dr Billy Coast updated with status - DC abx   Marlinda Mike CNM, MSN 06/14/2012, 9:07 AM

## 2012-06-14 NOTE — Progress Notes (Signed)
This was a follow-up visit with Penny [name] and her husband who are overjoyed at the birth of their daughter.  I shared with them in their joy and offered blessings to their family.  We will continue to follow up with this family when we see them in the NICU.  Please also page [place] as needs arise or as family requests.  Chaplain [name] 10:33 AM   06/14/12 1000  Clinical Encounter Type  Visited With Patient;Patient and family together  Visit Type Follow-up

## 2012-06-15 NOTE — Progress Notes (Signed)
Patient ID: Penny [name], female   DOB: [birth year], 36 y.o.   MRN: [id number] POD # 3  Subjective: Pt reports feeling well, but tired; mod pain/ Pain controlled with motrin and percocet Tolerating po/Voiding without problems/ No n/v/Flatus pos Activity: out of bed and ambulate Bleeding is light Newborn info:  Information for the patient's newborn:  Penny, Ahn Girl [name] [[id number]]  female Infant in NICU, stable.  Feeding: breast/pumping   Objective: VS: Blood pressure 143/87, pulse 100, temperature 97.1 F (36.2 C), temperature source Oral, resp. rate 19 LABS:  Basename 06/13/12 0125 06/12/12 2050  WBC 12.5* 13.9*  HGB 11.6* 13.2  HCT 34.2* 38.3  PLT 199 237     Physical Exam:  General: alert, cooperative and no distress CV: Regular rate and rhythm Resp: clear Abdomen: soft, nontender, normal bowel sounds Incision: well approximated, mod dependent edema Uterine Fundus: firm, below umbilicus, nontender Lochia: minimal Ext: edema trace and Homans sign is negative, no sign of DVT    A/P: POD # 3/ [id number]/ S/P C/Section @24wks  chorio, possible endo; afebrile >24hrs Labile BP's in past 12- 24 hrs.  Will plan to observe VS Q4 hr and possible labs Remove staples tomorrow    Signed: Demetrius Revel, MSN, [facility] 06/15/2012, 12:47 PM

## 2012-06-16 LAB — CBC
HCT: 32.2 % — ABNORMAL LOW (ref 36.0–46.0)
Hemoglobin: 10.7 g/dL — ABNORMAL LOW (ref 12.0–15.0)
MCH: 29.4 pg (ref 26.0–34.0)
MCHC: 33.2 g/dL (ref 30.0–36.0)
MCV: 88.5 fL (ref 78.0–100.0)
Platelets: 284 10*3/uL (ref 150–400)
RBC: 3.64 MIL/uL — ABNORMAL LOW (ref 3.87–5.11)
RDW: 13.5 % (ref 11.5–15.5)
WBC: 7.8 10*3/uL (ref 4.0–10.5)

## 2012-06-16 LAB — COMPREHENSIVE METABOLIC PANEL
ALT: 23 U/L (ref 0–35)
AST: 26 U/L (ref 0–37)
Albumin: 2.8 g/dL — ABNORMAL LOW (ref 3.5–5.2)
Alkaline Phosphatase: 123 U/L — ABNORMAL HIGH (ref 39–117)
BUN: 5 mg/dL — ABNORMAL LOW (ref 6–23)
CO2: 29 mEq/L (ref 19–32)
Calcium: 9.6 mg/dL (ref 8.4–10.5)
Chloride: 99 mEq/L (ref 96–112)
Creatinine, Ser: 0.51 mg/dL (ref 0.50–1.10)
GFR calc Af Amer: >90 mL/min (ref >90)
GFR calc non Af Amer: >90 mL/min (ref >90)
Glucose, Bld: 133 mg/dL — ABNORMAL HIGH (ref 70–99)
Potassium: 2.7 mEq/L — CL (ref 3.5–5.1)
Sodium: 140 mEq/L (ref 135–145)
Total Bilirubin: 0.4 mg/dL (ref 0.3–1.2)
Total Protein: 6.3 g/dL (ref 6.0–8.3)

## 2012-06-16 LAB — URIC ACID: Uric Acid, Serum: 3.7 mg/dL (ref 2.4–7.0)

## 2012-06-16 MED ORDER — POTASSIUM CHLORIDE CRYS ER 20 MEQ PO TBCR
20.0000 meq | EXTENDED_RELEASE_TABLET | Freq: Two times a day (BID) | ORAL | Status: DC
  Administered 2012-06-16 (×2): 20 meq via ORAL
  Filled 2012-06-16 (×4): qty 1

## 2012-06-16 MED ORDER — LABETALOL HCL 100 MG PO TABS
100.0000 mg | ORAL_TABLET | Freq: Two times a day (BID) | ORAL | Status: DC
  Administered 2012-06-16 – 2012-06-17 (×3): 100 mg via ORAL
  Filled 2012-06-16 (×5): qty 1

## 2012-06-16 NOTE — Progress Notes (Signed)
POSTOPERATIVE DAY # 4 S/P cesarean section   S:         Reports feeling well / no PIH symptoms / some sharp pulling right side of incision with staples             Tolerating po intake / no nausea / no vomiting / + flatus / no BM             Bleeding is light             Pain controlled with motrin adn percocet             Up ad lib / ambulatory / voiding QS  Newborn in NICU - pumping for feeding    O:  A & O x 3              VS: Blood pressure 135/86, pulse 82, temperature 97.7 F (36.5 C), temperature source Oral, resp. rate 18, height 5\' 5"  (1.651 m), weight 85.458 kg (188 lb 6.4 oz), last menstrual period 12/12/2011, SpO2 100.00%, unknown if currently breastfeeding.  LABS: WBC/Hgb/Hct/Plts:  7.8/10.7/32.2/284 (08/31 1053)   Lungs: Clear and unlabored  Heart: regular rate and rhythm   Abdomen: soft, non-tender, non-distended, active BS             Fundus: firm, non-tender, U-2                         Dressing OFF              Incision:  approximated with staples / no erythema / no ecchymosis / no drainage                             staples removed / edges well-approximated / benzoin and steri-strips applied  Perineum: no edema  Lochia: light  Extremities: no edema, no calf pain or tenderness, negative Homans  A:        POD # 4 S/P cesarean at 24 weeks            Elevated BP - no evidence of PEC            Hypokalemia  P:        Routine postoperative care              Consult Dr mody - start Labetalol 100 mg BID and K-Dur BID             Monitor BP as in-patient then reevaluate tomorrow for discharge     Marlinda Mike CNM, MSN 06/16/2012, 1:04 PM

## 2012-06-16 NOTE — Clinical Social Work Note (Signed)
Clinical Social Work Department PSYCHOSOCIAL ASSESSMENT - MATERNAL/CHILD 06/16/2012  Patient:  ANEITA, KIGER  Account Number:  [id number]  Admit Date:  05/20/2012  Marjo Bicker Name:   Erlene Senters    Clinical Social Worker:  Truman Hayward, LCSW   Date/Time:  06/16/2012 03:30 PM  Date Referred:  06/16/2012   Referral source  Physician     Referred reason  NICU   Other referral source:    I:  FAMILY / HOME ENVIRONMENT Child's legal guardian:  PARENT  Guardian - Name Guardian - Age Guardian - Address  Granby Iyengar 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, agricultural for Cabin crew resources:  Medicaid If Medicaid - County:  [employer] / Grade:   Maternity Care Coordinator / Child Services Coordination / Early Interventions:  Cultural issues impacting care:    III  STRENGTHS Strengths  Adequate Resources  Supportive family/friends  Compliance with medical plan  Understanding of illness  Home prepared for Child (including basic supplies)   Strength comment:    IV  RISK FACTORS AND CURRENT PROBLEMS Current Problem:  None   Risk Factor & Current Problem Patient Issue Family Issue Risk Factor / Current Problem Comment   N N     V  SOCIAL WORK ASSESSMENT CSW spoke with MOB and FOB in room.  Discussed admission to NICU and SW role while infant is in NICU.  Discussed diagnosis and emotions around admission.  MOB and FOB report doing okay and taking it one day at a time.  MOB has had 4 miscarriages in the past, most recent in 2012, and states no emotional concerns at this  time.  CSW provided psychoeducation on sypmtoms of anxiety and depression and instructed MOB to let CSW if any concerns arise.  Discussed insurance and any concerns, MOB and FOB reported none. Discussed any concerns with supplies of family support. Both expressed no concern, however they will let CSW know if any concerns arise.  Provided information on SSI and discussed assiatance it would provide.  Completed SSI worksheet.  CSW will continue to follow and offer support while infant is in NICU.      VI SOCIAL WORK PLAN Social Work Plan  Psychosocial Support/Ongoing Assessment of Needs   Type of pt/family education:   education on depression and anxiety symptoms.   If child protective services report - county:   If child protective services report - date:   Information/referral to community resources comment:   Other social work plan:

## 2012-06-16 NOTE — Progress Notes (Signed)
POSTOPERATIVE DAY # 4 S/P cesarean section at 24 weeks   S:         Patient in NICU this am              Newborn stable in NICU O:               VS: Blood pressure 149/97, pulse 76, temperature 98.3 F (36.8 C), temperature source Oral, resp. rate 16, height 5\' 5"  (1.651 m), weight 188 lb 6.4 oz (85.458 kg), last menstrual period 12/12/2011, SpO2 100.00%, unknown if currently breastfeeding.               Blood pressures elevated x 24 hours - no history of pre-existing hypertension             BP range : 122-149 / 92-104  A:        POD # 4 S/P cesarean section at 24 weeks            Elevated blood pressure postpartum - possible postpartum PEC development  P:        Routine postoperative care              PIH labs - stat this am             Evaluate patient when returned from NICU visit and labs resulted             Update MD with status when lab returned    Marlinda Mike CNM, MSN 06/16/2012, 10:44 AM

## 2012-06-17 LAB — COMPREHENSIVE METABOLIC PANEL
ALT: 23 U/L (ref 0–35)
AST: 25 U/L (ref 0–37)
Albumin: 2.6 g/dL — ABNORMAL LOW (ref 3.5–5.2)
Alkaline Phosphatase: 102 U/L (ref 39–117)
BUN: 5 mg/dL — ABNORMAL LOW (ref 6–23)
CO2: 29 mEq/L (ref 19–32)
Calcium: 9.4 mg/dL (ref 8.4–10.5)
Chloride: 103 mEq/L (ref 96–112)
Creatinine, Ser: 0.48 mg/dL — ABNORMAL LOW (ref 0.50–1.10)
GFR calc Af Amer: >90 mL/min (ref >90)
GFR calc non Af Amer: >90 mL/min (ref >90)
Glucose, Bld: 121 mg/dL — ABNORMAL HIGH (ref 70–99)
Potassium: 2.9 mEq/L — ABNORMAL LOW (ref 3.5–5.1)
Sodium: 142 mEq/L (ref 135–145)
Total Bilirubin: 0.3 mg/dL (ref 0.3–1.2)
Total Protein: 5.6 g/dL — ABNORMAL LOW (ref 6.0–8.3)

## 2012-06-17 MED ORDER — POTASSIUM CHLORIDE CRYS ER 20 MEQ PO TBCR: 40.0000 meq | EXTENDED_RELEASE_TABLET | Freq: Two times a day (BID) | ORAL | Status: DC

## 2012-06-17 MED ORDER — LABETALOL HCL 100 MG PO TABS: 100.0000 mg | ORAL_TABLET | Freq: Two times a day (BID) | ORAL | Status: DC

## 2012-06-17 MED ORDER — IBUPROFEN 600 MG PO TABS: 600.0000 mg | ORAL_TABLET | Freq: Four times a day (QID) | ORAL | Status: AC | PRN

## 2012-06-17 MED ORDER — OXYCODONE-ACETAMINOPHEN 5-325 MG PO TABS: 1.0000 | ORAL_TABLET | ORAL | Status: AC | PRN

## 2012-06-17 MED ORDER — POTASSIUM CHLORIDE CRYS ER 20 MEQ PO TBCR
40.0000 meq | EXTENDED_RELEASE_TABLET | Freq: Two times a day (BID) | ORAL | Status: DC
  Administered 2012-06-17: 40 meq via ORAL
  Filled 2012-06-17 (×3): qty 2

## 2012-06-17 MED ORDER — PRENATAL MULTIVITAMIN CH: 1.0000 | ORAL_TABLET | Freq: Every day | ORAL | Status: AC

## 2012-06-17 NOTE — Progress Notes (Signed)
PT D/C HOME WITH FAMILY, AMBULATORY TO PRIVATE CAR. D/C INSTRUCTIONS AND PRESCRIPTIONS REVIEWED WITH PT. PT VERBALIZED UNDERSTANDING.  

## 2012-06-17 NOTE — Progress Notes (Signed)
POSTOPERATIVE DAY # 5 S/P cesarean section at 24 wks   S:         Reports feeling well - little soreness only             Tolerating po intake / no nausea / no vomiting / + flatus / + BM             Bleeding is spotting             Pain controlled with motrin and percocet             Up ad lib / ambulatory  Newborn stable in NICU - pumping for feedings / milk in last night     O:  A & O x 3    VS: Blood pressure 125/83, pulse 69, temperature 97.8 F (36.6 C), temperature source Oral, resp. rate 16, height 5\' 5"  (1.651 m), weight 85.458 kg (188 lb 6.4 oz), last menstrual period 12/12/2011, SpO2 98.00%, unknown if currently breastfeeding.  BP much better range : 125-153 / 65-86  LABS: WBC/Hgb/Hct/Plts:  7.8/10.7/32.2/284 (08/31 1053)  CMP: 2.9 on K-Dur   Lungs: Clear and unlabored  Heart: regular rate and rhythm / no mumurs  Abdomen: soft, non-tender, non-distended              Fundus: firm, non-tender, U-2             Incision:  Well-approximated with steri-strips intact / no erythema / no ecchymosis / no drainage  Perineum: no edema  Lochia: light spotting  Extremities: no edema, no calf pain or tenderness, negative Homans  A:        POD # 5 S/P cesarean section            Postpartum hypertension - controlled with labetalol / no evidence of PEC            Hypokalemia  P:        Routine postoperative care              Continue labetalol 100 BID - effective for BP control             Recheck BP - decision for further continuation of medication end of next week             Increase Kdur to 40 meq BID - repeat CMP in 5 days   Marlinda Mike CNM, MSN 06/17/2012, 8:18 AM

## 2012-06-17 NOTE — Discharge Summary (Signed)
POSTOPERATIVE DISCHARGE SUMMARY:  Patient IDMarshayla [name] MRN: [id number] DOB/AGE: [birth year] 36 y.o.  Admit date: 05/20/2012 Discharge date:  06/17/2012  Admission Diagnoses:  21 1/7 incompetent cervix with rescue cerclage in place  threatened preterm birth  poor obstetrical history    Discharge Diagnoses:  POD # 5 s/p cesarean section (24 weeks) Elevated BP - gestational etiology / no evidence of preeclampsia Hypokalemia  Prenatal history: [id number]   EDC : 09/29/2012, Alternate EDD Entry  P[facility] care at [facility]  Primary provider : Dr Billy Coast Prenatal course complicated by poor obstetrical history / ectopic pregnancy / recurrent pregnancy loss  Prenatal Labs: ABO, Rh: O (07/17 0000) positive Antibody: NEG (08/27 2050) Rubella: Immune (07/17 0000)  RPR: NON REACTIVE (08/27 2050)  HBsAg: Negative (05/10 0000)  HIV: Non-reactive (07/17 0000)  GBS:   negative ( 7/31)  Medical / Surgical History :  Past medical history:  Past Medical History  Diagnosis Date  . Polycystic ovaries   . Cervical insufficiency in pregnancy, antepartum ([redacted]w[redacted]d) 05/02/2012  . McDonald cerclage present 05/02/2012  . Postpartum care following cesarean delivery (8/27 - [redacted]w[redacted]d abruption) 06/13/2012    Past surgical history:  Past Surgical History  Procedure Date  . Ectopic pregnancy surgery   . Salpingectomy     Right  . Cervical cerclage 05/01/2012    Procedure: CERCLAGE CERVICAL;  Surgeon: Lenoard Aden, MD;  Location: [facility];  Service: Gynecology;  Laterality: N/A;  rescue 18wks  . Cesarean section 06/12/2012    Procedure: CESAREAN SECTION;  Surgeon: Lenoard Aden, MD;  Location: [facility];  Service: Gynecology;  Laterality: N/A;    Family History: History reviewed. No pertinent family history.  Social History:  reports that she has never smoked. She does not have any smokeless tobacco history on file. She reports that she does not drink alcohol or use illicit  drugs.  Allergies: Review of patient's allergies indicates no known allergies.   Current Medications at time of admission:  Prenatal Vit-Fe Fumarate-FA (PRENATAL MULTIVITAMIN) TABS Take 1 tablet by mouth daily. 06/17/12   Marlinda Mike, [facility] Course:  readmit on 8/4 at 21 weeks 1/7 days with spotting with rescue cerclage admission exam cervix dilated to 1 cm with membranes visible thru level of cerclage placed in trendelenburg with follow-up ultrasound thru MFM showing no measurable cervix vaginal progesterone for ctx suppression  physical therapy consult and management for long-term bedrest 23 weeks - increased pressure / pessary placed on 8/15 BMZ course completed 24 weeks increased BP intermittently - no evidence of preeclampsia 8/25 initiated on Macrobid for UTI symptoms - negative urine culture GBS negative 7/31 8/27 onset of cramping and pain / bleeding / gush of fluid - consistent with SROM and labor           Procedures: Cesarean section delivery of female newborn by Dr Billy Coast  See operative report for further details Newborn to NICU  Postoperative / postpartum course:  1) elevated BP on POD 3 without evidence of preeclampsia       initiated on labetalol 100 mg BID with stable BP within 12 hours of medication initiation 2) hypokalemia       initiated K-Du 20 meQ with  Minimal response - increased to 40 meQ daily      Repeat lab in 5 days  Physical Exam:   VSS: Blood pressure 125/83, pulse 69, temperature 97.8 F (36.6 C), temperature source Oral, resp. rate 16, height 5\' 5"  (  1.651 m), weight 85.458 kg (188 lb 6.4 oz), last menstrual period 12/12/2011, SpO2 98.00%, unknown if currently breastfeeding.  LABS: WBC/Hgb/Hct/Plts:  7.8/10.7/32.2/284 (08/31 1053)  General: NAD / pleasant Heart: RR Lungs: clear Abdomen: soft / bowel sounds active / + BM Extremities: no edema  Incision:  Well-approximated with steri-stips / no erythema / no ecchymosis / no  drainage  Discharge Instructions:  Discharged Condition: stable Activity: pelvic rest and postoperative restrictions Diet: routine Medications: see below Medication List  As of 06/17/2012  9:25 AM   STOP taking these medications         progesterone 200 MG capsule         TAKE these medications         ibuprofen 600 MG tablet   Commonly known as: ADVIL,MOTRIN   Take 1 tablet (600 mg total) by mouth every 6 (six) hours as needed.      labetalol 100 MG tablet   Commonly known as: NORMODYNE   Take 1 tablet (100 mg total) by mouth 2 (two) times daily.      oxyCODONE-acetaminophen 5-325 MG per tablet   Commonly known as: PERCOCET/ROXICET   Take 1 tablet by mouth every 4 (four) hours as needed (moderate - severe pain).      potassium chloride SA 20 MEQ tablet   Commonly known as: K-DUR,KLOR-CON   Take 2 tablets (40 mEq total) by mouth 2 (two) times daily.      prenatal multivitamin Tabs   Take 1 tablet by mouth daily.           Condition: stable Postpartum Instructions: refer to practice specific booklet Discharge to: home Disposition: 01-Home or Self Care Follow up :  Follow-up Information    Call [facility]. (appointment Sept 5 with Marlinda Mike for BP  recheck)          Signed: Marlinda Mike CNM, MSN 06/17/2012, 9:25 AM

## 2012-06-29 NOTE — Discharge Summary (Signed)
NAMESTEVE, GREGG NO.:  [id number]  MEDICAL RECORD NO.:  [id number]  LOCATION:  9305                          FACILITY:  [facility]  PHYSICIAN:  Lenoard Aden, M.D.DATE OF BIRTH:  [birth year]  DATE OF ADMISSION:  05/20/2012 DATE OF DISCHARGE:  06/17/2012                              DISCHARGE SUMMARY   HISTORY OF PRESENT ILLNESS:  The patient was admitted for cervical incompetence status post emergency cerclage that was previously placed. She was admitted on May 20, 2012, discharged on June 17, 2012.  HOSPITAL COURSE:  The patient was admitted, was supine due to rescue cerclage, was dilated on re-admission.  Due to the patient's voluntary admission, wished to stay in a Trendelenburg position, was left on vaginal progesterone __________.  On June 12, 2012, she began cramping, ruptured membranes, and __________ with abruption.  She had an urgent primary low segment transverse cesarean section.  Her postpartum course was uncomplicated.  She was discharged to home.  DISCHARGE MEDICATIONS:  Include previous discharge summary provided by Marlinda Mike.  She was discharged home to follow up in the office in 1 week.  Please note additional information in a previously done discharge summary by Marlinda Mike.     Lenoard Aden, M.D.     RJT/MEDQ  D:  06/28/2012  T:  06/29/2012  Job:  [id number]

## 2014-04-25 IMAGING — US US OB TRANSVAGINAL
1 series · 13 of 24 positions shown · non-contrast
Comparison: none

[Series 1: us ob transvaginal · 0.10mm/px · 13 of 24 slices shown]
[im 1/24]
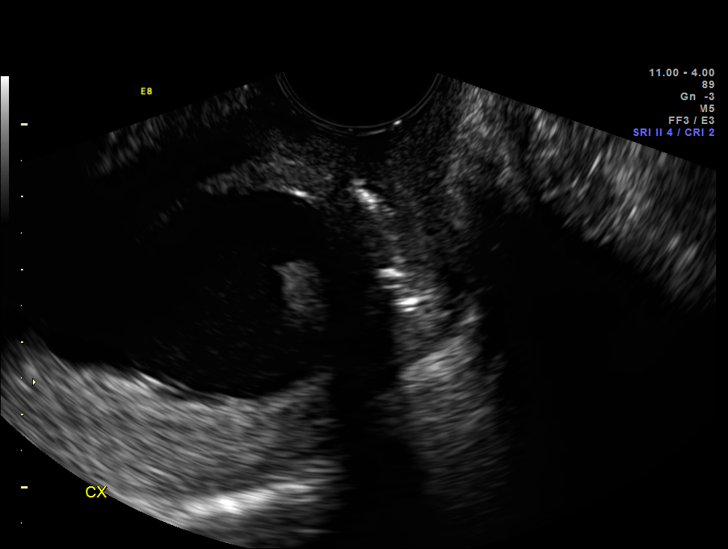
[im 3/24]
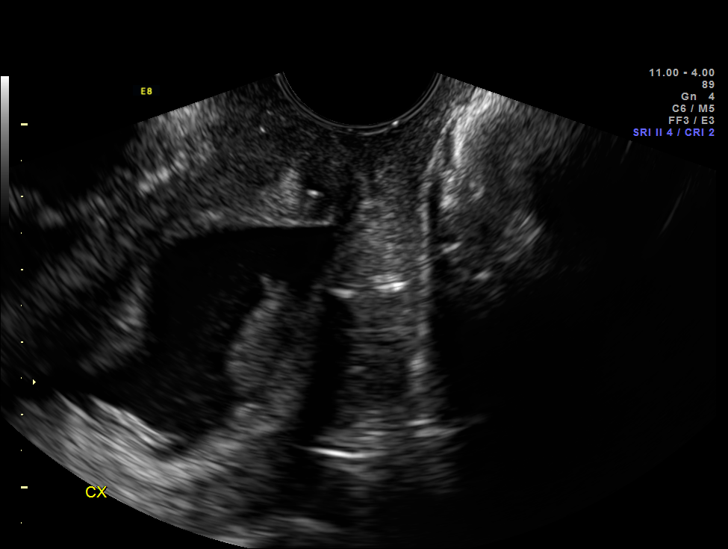
[im 5/24]
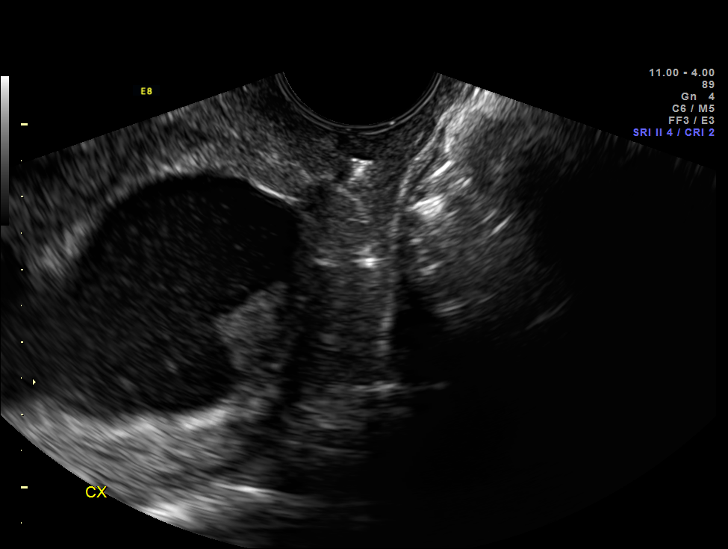
[im 7/24]
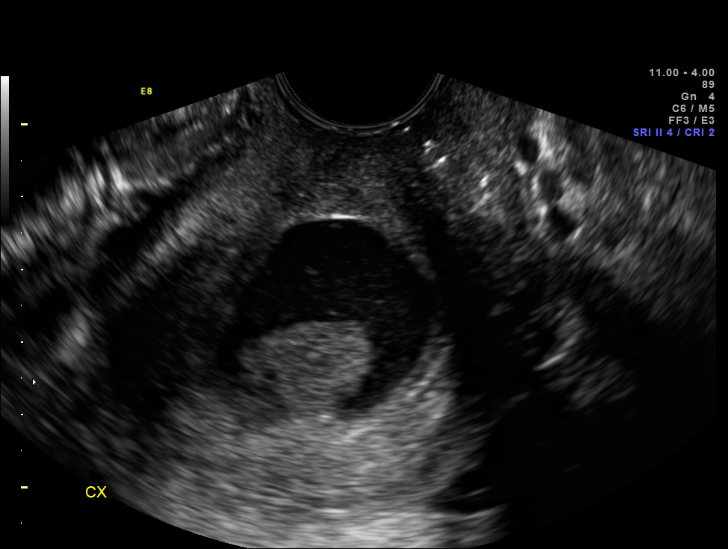
[im 9/24]
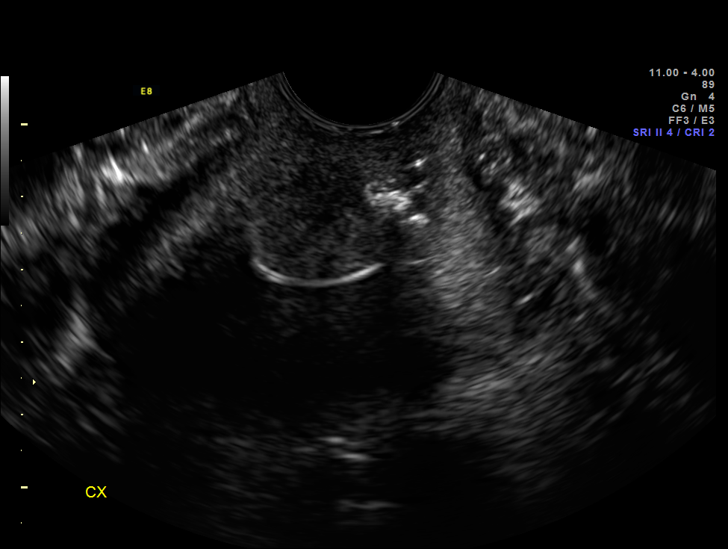
[im 11/24]
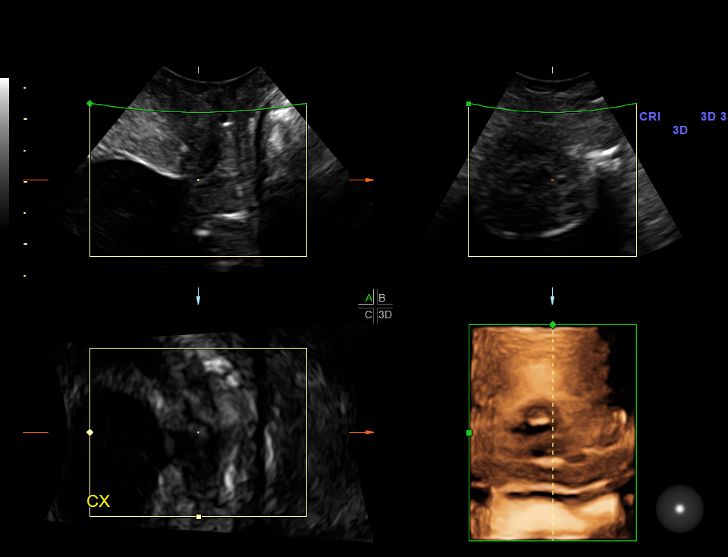
[im 13/24]
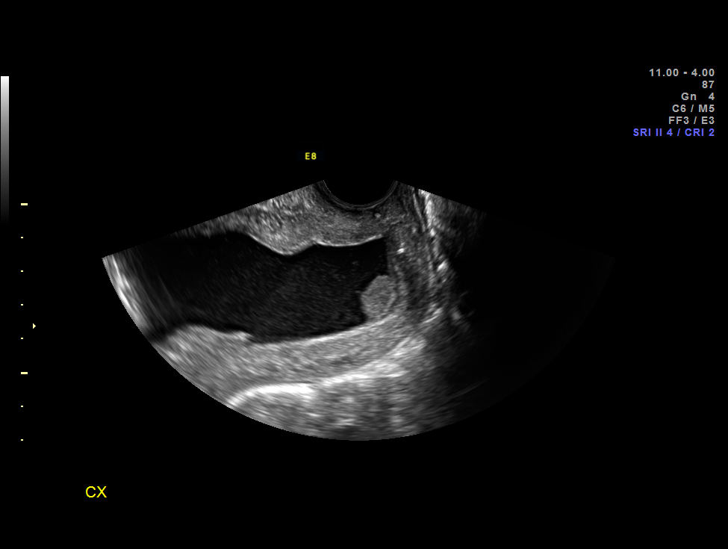
[im 14/24]
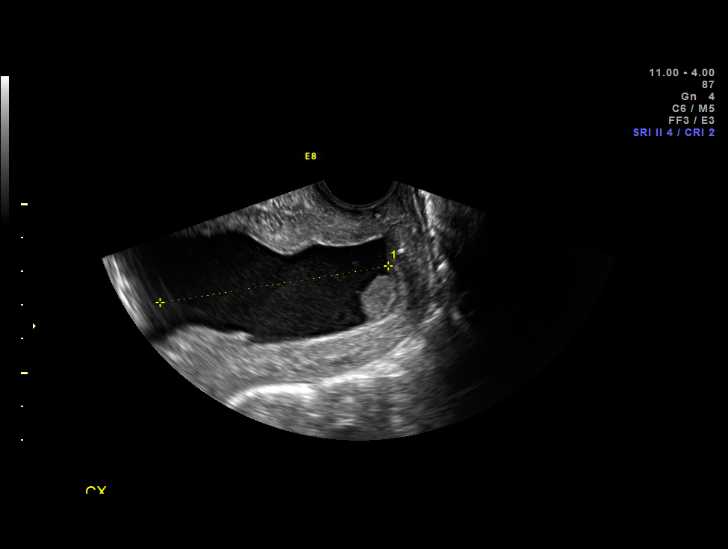
[im 16/24]
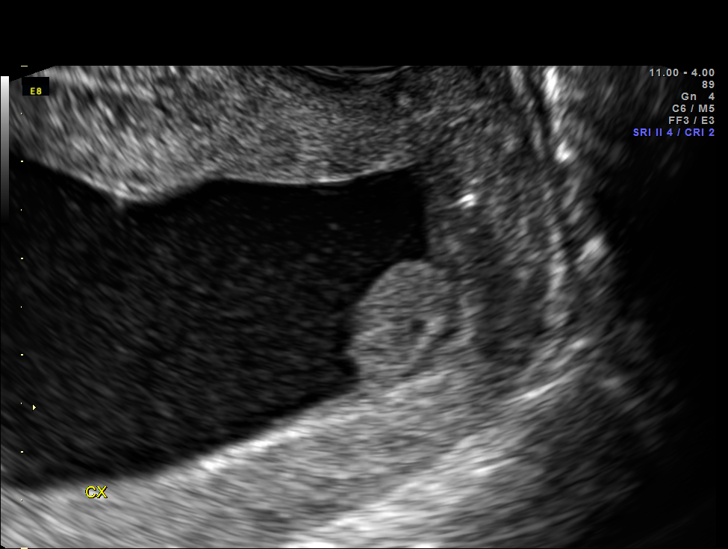
[im 18/24]
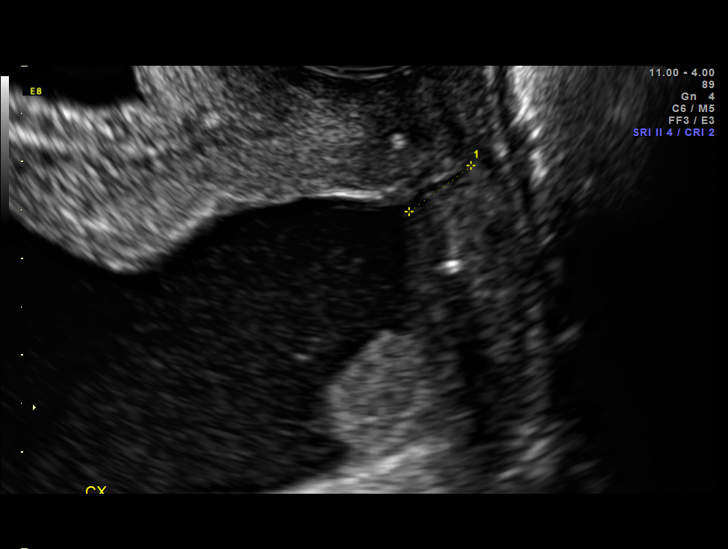
[im 20/24]
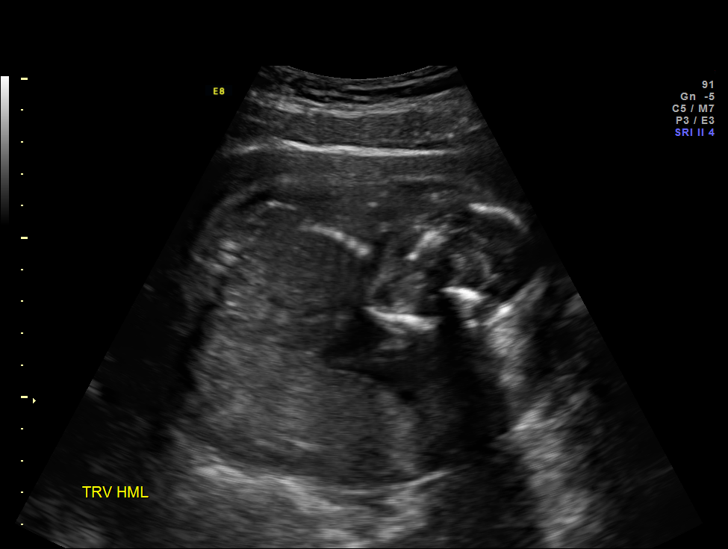
[im 22/24]
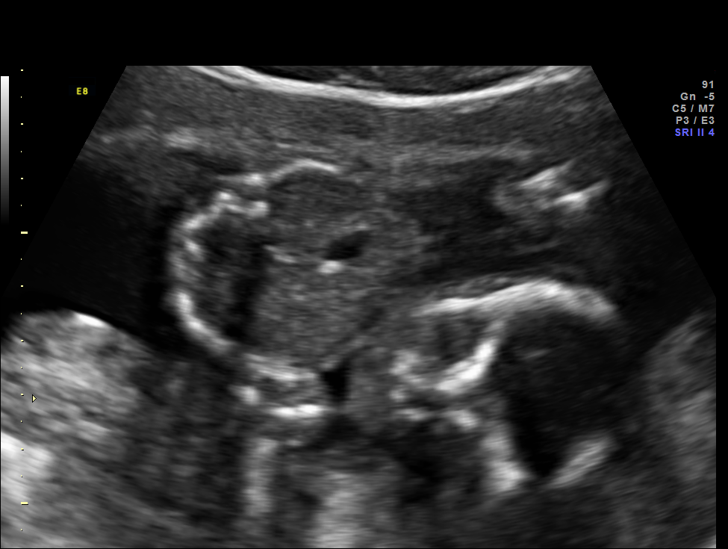
[im 24/24]
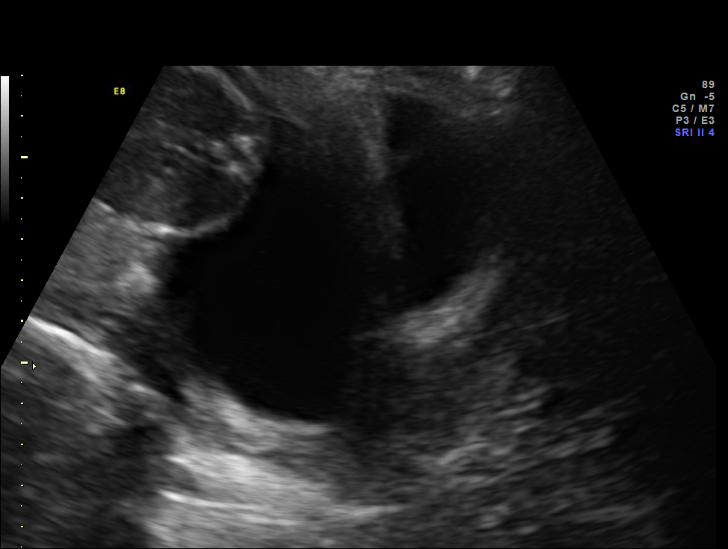

[13 of 24 positions shown; findings below may reference images not displayed]

OBSTETRICS REPORT
                      (Signed Final 05/15/2012 [DATE])

 Order#:         [id number]
Procedures

 US OB TRANSVAGINAL                                    76817.0
Indications

 S/P rescue cerclage
 Assess cervical length
 Cervical insufficiency
 Vaginal bleeding, unknown etiology
Fetal Evaluation

 Fetal Heart Rate:  165                          bpm
 Cardiac Activity:  Observed
 Presentation:      Cephalic
 Placenta:          Posterior, above cervical
                    os

 Amniotic Fluid
 AFI FV:      Subjectively within normal limits
Gestational Age

 LMP:           22w 1d        Date:  12/12/11                 EDD:   09/17/12
 Best:          20w 3d     Det. By:  Early Ultrasound         EDD:   09/29/12
Cervix Uterus Adnexa

 Cervical Length:    0.8      cm

 Cervix:       Normal appearance by transvaginal scan
Impression

 Single IUP at 20 [DATE] weeks
 Limited ultrasound performed for cervical length
 TVUS - cerivcal length of 8 mm.  Cerclage stitch appears
 intact.  U-shaped funneling noted to the level of the stitch.
Recommendations

 Recommend inpatient observation, bedrest.  Follow up
 cervical length in 1 week.
 questions or concerns.

## 2014-05-09 IMAGING — US US OB TRANSVAGINAL
1 series · 4 of 4 positions shown · non-contrast
Comparison: none

[Series 1: us ob transvaginal · 0.29mm/px · 4 of 4 slices shown]
[im 1/4]
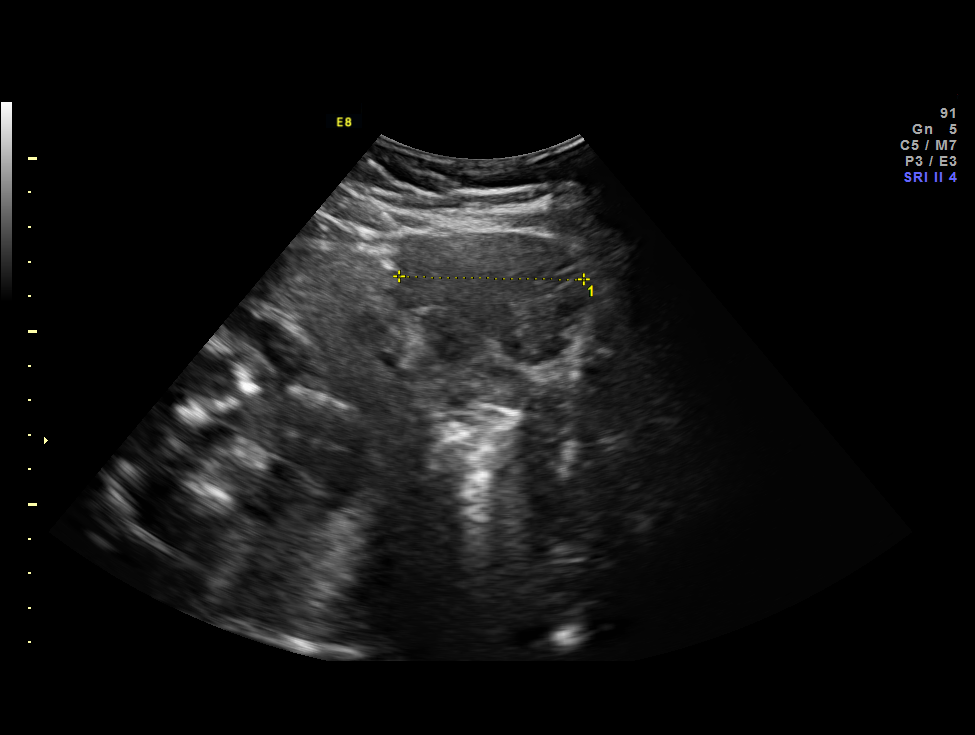
[im 2/4]
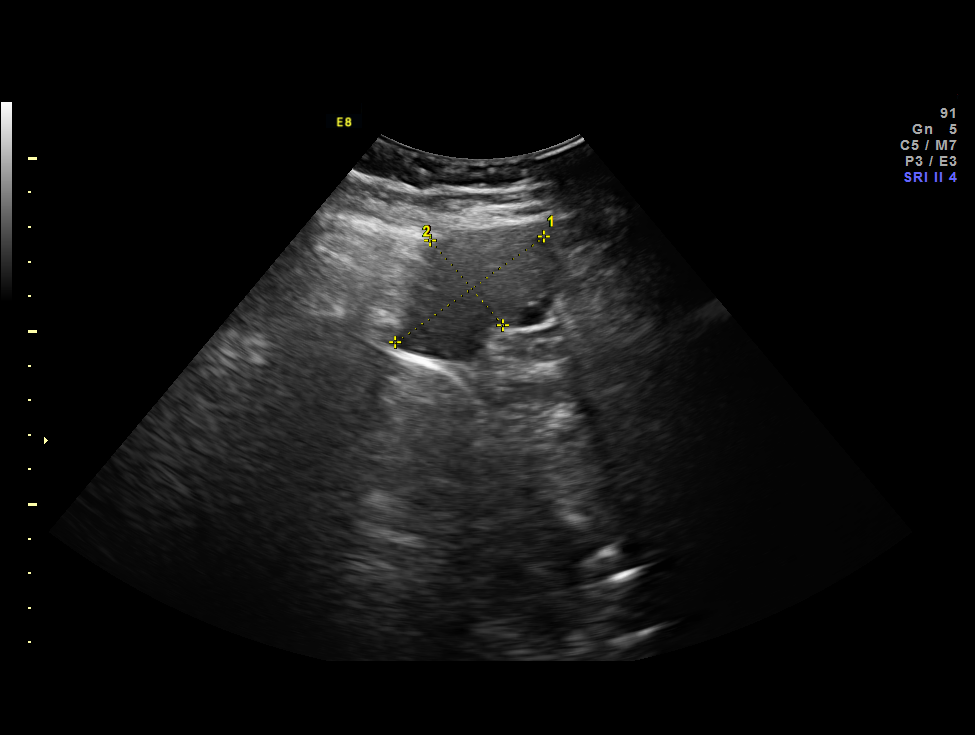
[im 3/4]
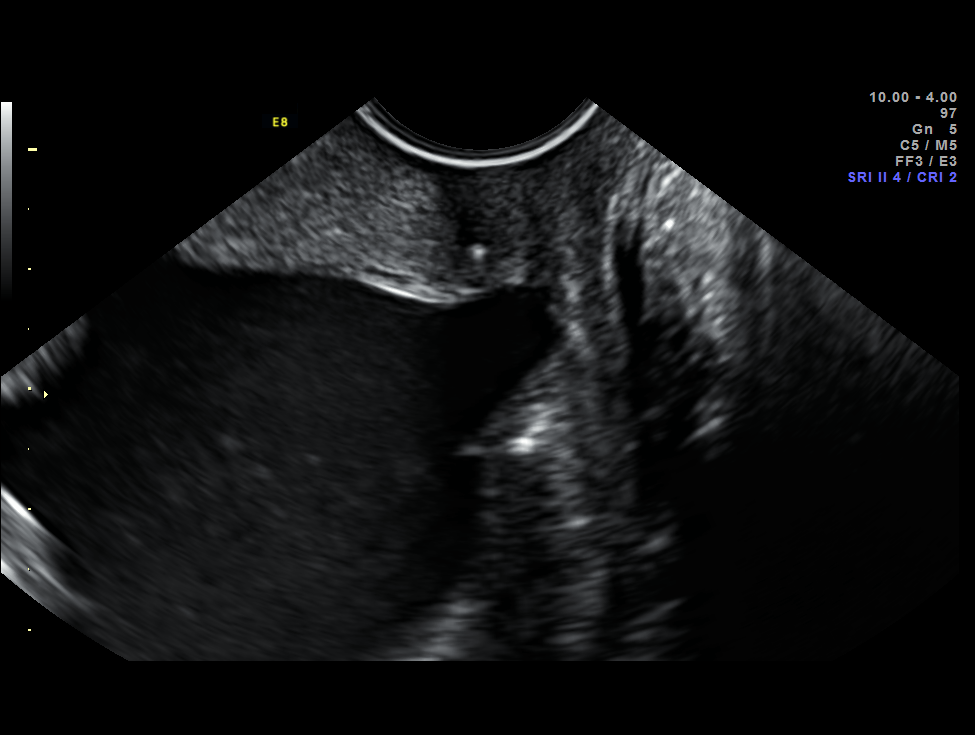
[im 4/4]
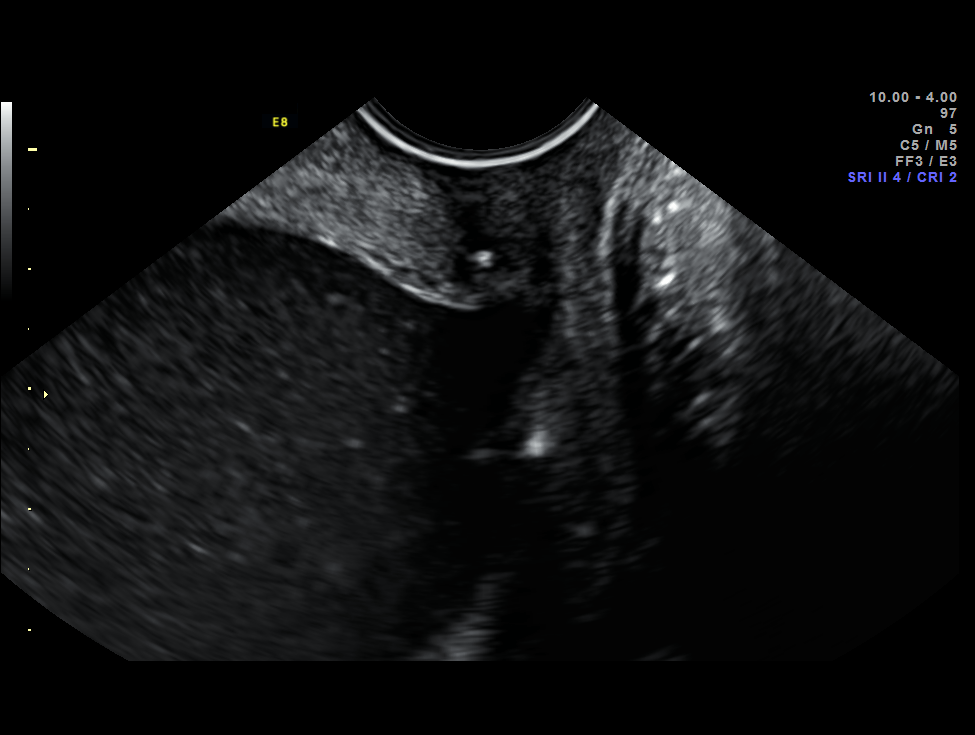

[4 of 4 positions shown; findings below may reference images not displayed]

OBSTETRICS REPORT
                      (Signed Final 05/29/2012 [DATE])

 Order#:         [id number]
Procedures

 US OB TRANSVAGINAL                                    76817.0
Indications

 Cervical insufficiency
 S/P rescue cerclage
 Assess cervical length
Fetal Evaluation

 Fetal Heart Rate:  155                          bpm
 Cardiac Activity:  Observed
 Presentation:      Breech
 Placenta:          Posterior, above cervical
                    os

 Amniotic Fluid
 AFI FV:      Subjectively within normal limits
                                             Larg Pckt:    3.53  cm
Gestational Age

 LMP:           24w 1d        Date:  12/12/11                 EDD:   09/17/12
 Best:          22w 3d     Det. By:  Early Ultrasound         EDD:   09/29/12
Cervix Uterus Adnexa

 Cervix:       Appears funnelled.  Cerclage visualized.
 Uterus:       No abnormality visualized.
 Cul De Sac:   No free fluid seen.
 Left Ovary:    Within normal limits.
 Right Ovary:   Within normal limits.
 Adnexa:     No abnormality visualized.
Impression

 IUP at  22+3 weeks
 EV views of cervix: significant U - shaped funneling which is
 now past the cerclage; suture intact; difficult to assess, but
 appeared to have no closed portion distally - possibly 
 5 mm
 dilated
Recommendations

 Continue in patient observation
 Reassess cervix in one week or sooner with change in
 clinical findings

 questions or concerns.

## 2014-08-18 ENCOUNTER — Encounter (HOSPITAL_COMMUNITY): Payer: Self-pay

## 2014-12-11 LAB — OB RESULTS CONSOLE ABO/RH: RH TYPE: POSITIVE

## 2014-12-11 LAB — OB RESULTS CONSOLE HEPATITIS B SURFACE ANTIGEN: HEP B S AG: NEGATIVE

## 2014-12-11 LAB — OB RESULTS CONSOLE ANTIBODY SCREEN: Antibody Screen: NEGATIVE

## 2014-12-11 LAB — OB RESULTS CONSOLE RUBELLA ANTIBODY, IGM: Rubella: IMMUNE

## 2014-12-11 LAB — OB RESULTS CONSOLE HIV ANTIBODY (ROUTINE TESTING): HIV: NONREACTIVE

## 2014-12-11 LAB — OB RESULTS CONSOLE RPR: RPR: NONREACTIVE

## 2014-12-16 DIAGNOSIS — Z9889 Other specified postprocedural states: Secondary | ICD-10-CM

## 2014-12-16 HISTORY — DX: Other specified postprocedural states: Z98.890

## 2014-12-17 ENCOUNTER — Encounter (HOSPITAL_BASED_OUTPATIENT_CLINIC_OR_DEPARTMENT_OTHER): Payer: Self-pay | Admitting: *Deleted

## 2014-12-22 LAB — OB RESULTS CONSOLE GC/CHLAMYDIA
Chlamydia: NEGATIVE
Gonorrhea: NEGATIVE

## 2015-01-07 NOTE — H&P (Addendum)
SHAHAD MAZUREK is a 39 y.o. female , originally referred to me by Dr. Billy Coast, for infertility, currently 12/5 gestation with a history of incompetent cervix and premature rupture of membranes resulting in an emergency cesarean at 24 weeks and 5 days. Pt had infertility treatment for PCOS and multiple recent SABs and one tubal pregnancy. This pregnancy was conceived naturally on metformin alone.  Pertinent Gynecological History: Menses: currently pregnant Bleeding: currently pregnant Contraception:currently pregnant DES exposure: denies Blood transfusions: none Sexually transmitted diseases: no past history Previous GYN Procedures: C/S, R Salpingectomy, D&C  Last mammogram: n/a Last pap: normal  OB History: G 5, P 1, SAB 3, Ectopic 1   Menstrual History: Menarche age: 59 No LMP recorded.    Past Medical History  Diagnosis Date  . Polycystic ovaries   . Cervical insufficiency in pregnancy, antepartum ([redacted]w[redacted]d) 05/02/2012  . McDonald cerclage present 05/02/2012  . Postpartum care following cesarean delivery (8/27 - [redacted]w[redacted]d abruption) 06/13/2012                    Past Surgical History  Procedure Laterality Date  . Ectopic pregnancy surgery    . Salpingectomy      Right  . Cervical cerclage  05/01/2012    Procedure: CERCLAGE CERVICAL;  Surgeon: Lenoard Aden, MD;  Location: [facility];  Service: Gynecology;  Laterality: N/A;  rescue 18wks  . Cesarean section  06/12/2012    Procedure: CESAREAN SECTION;  Surgeon: Lenoard Aden, MD;  Location: [facility];  Service: Gynecology;  Laterality: N/A;             History reviewed. No pertinent family history. No hereditary disease.  No cancer of breast, ovary, uterus. No cutaneous leiomyomatosis or renal cell carcinoma.  History   Social History  . Marital Status: Married    Spouse Name: N/A  . Number of Children: N/A  . Years of Education: N/A   Occupational History  . Not on file.   Social History Main Topics  . Smoking status:  Never Smoker   . Smokeless tobacco: Not on file  . Alcohol Use: No  . Drug Use: No  . Sexual Activity: Yes   Other Topics Concern  . Not on file   Social History Narrative    No Known Allergies  No current facility-administered medications on file prior to encounter.   Current Outpatient Prescriptions on File Prior to Encounter  Medication Sig Dispense Refill  . labetalol (NORMODYNE) 100 MG tablet Take 1 tablet (100 mg total) by mouth 2 (two) times daily. 28 tablet 0  . potassium chloride SA (K-DUR,KLOR-CON) 20 MEQ tablet Take 2 tablets (40 mEq total) by mouth 2 (two) times daily. 14 tablet 0  . Prenatal Vit-Fe Fumarate-FA (PRENATAL MULTIVITAMIN) TABS Take 1 tablet by mouth daily. 90 tablet 4     Review of Systems  Constitutional: Negative.   HENT: Negative.   Eyes: Negative.   Respiratory: Negative.   Cardiovascular: Negative.   Gastrointestinal: Negative.   Genitourinary: Negative.   Musculoskeletal: Negative.   Skin: Negative.   Neurological: Negative.   Endo/Heme/Allergies: Negative.   Psychiatric/Behavioral: Negative.      Physical Exam  There were no vitals taken for this visit. Constitutional: She is oriented to person, place, and time. She appears well-developed and well-nourished.  HENT:  Head: Normocephalic and atraumatic.  Nose: Nose normal.  Mouth/Throat: Oropharynx is clear and moist. No oropharyngeal exudate.  Eyes: Conjunctivae normal and EOM are normal. Pupils are equal,  round, and reactive to light. No scleral icterus.  Neck: Normal range of motion. Neck supple. No tracheal deviation present. No thyromegaly present.  Cardiovascular: Normal rate.   Respiratory: Effort normal and breath sounds normal.  GI: Soft. Bowel sounds are normal. She exhibits no distension and no mass. There is no tenderness.  Lymphadenopathy:    She has no cervical adenopathy.  Neurological: She is alert and oriented to person, place, and time. She has normal reflexes.   Skin: Skin is warm.  Psychiatric: She has a normal mood and affect. Her behavior is normal. Judgment and thought content normal.    Assessment/Plan: IUP at 12 + wk with hx of cervical insufficiency, for L/S TAC I had a long discussion with this patient and her husband about the pros and cons of transabdominal laparoscopic cervical isthmic cerclage versus elective transvaginal cerclage.  The couple has carefully considered the extremely premature delivery she had with the rescue cerclage and they would like to procedure that is most likely to result in a successful outcome.  I recommended TAC. Given her history of miscarriages, we chose to perform this procedure at 12 weeks after she has had noninvasive prenatal genetic testing done (she later declined such tesing). Patient understands that she will need careful OB monitoring during her next pregnancy, despite TAC.  Fermin SchwabYALCINKAYA,Kinsey Karch

## 2015-01-07 NOTE — Anesthesia Preprocedure Evaluation (Addendum)
Anesthesia Evaluation  Patient identified by MRN, date of birth, ID band Patient awake    Reviewed: Allergy & Precautions, NPO status , Patient's Chart, lab work & pertinent test results  History of Anesthesia Complications Negative for: history of anesthetic complications  Airway Mallampati: II  TM Distance: >3 FB Neck ROM: Full    Dental no notable dental hx. (+) Dental Advisory Given   Pulmonary neg pulmonary ROS,  breath sounds clear to auscultation  Pulmonary exam normal       Cardiovascular negative cardio ROS  Rhythm:Regular Rate:Normal     Neuro/Psych negative neurological ROS  negative psych ROS   GI/Hepatic negative GI ROS, Neg liver ROS,   Endo/Other  diabetes, Oral Hypoglycemic AgentsObesity   Renal/GU negative Renal ROS  negative genitourinary   Musculoskeletal negative musculoskeletal ROS (+)   Abdominal   Peds negative pediatric ROS (+)  Hematology negative hematology ROS (+)   Anesthesia Other Findings   Reproductive/Obstetrics (+) Pregnancy                             Anesthesia Physical Anesthesia Plan  ASA: II  Anesthesia Plan: General   Post-op Pain Management:    Induction: Intravenous  Airway Management Planned: Oral ETT  Additional Equipment:   Intra-op Plan:   Post-operative Plan: Extubation in OR  Informed Consent: I have reviewed the patients History and Physical, chart, labs and discussed the procedure including the risks, benefits and alternatives for the proposed anesthesia with the patient or authorized representative who has indicated his/her understanding and acceptance.   Dental advisory given  Plan Discussed with: CRNA  Anesthesia Plan Comments:        Anesthesia Quick Evaluation

## 2015-01-08 ENCOUNTER — Encounter (HOSPITAL_COMMUNITY): Payer: Self-pay | Admitting: *Deleted

## 2015-01-08 ENCOUNTER — Ambulatory Visit (HOSPITAL_COMMUNITY): Payer: BLUE CROSS/BLUE SHIELD

## 2015-01-08 ENCOUNTER — Ambulatory Visit (HOSPITAL_COMMUNITY): Payer: BLUE CROSS/BLUE SHIELD | Admitting: Anesthesiology

## 2015-01-08 ENCOUNTER — Encounter (HOSPITAL_COMMUNITY)
Admission: RE | Disposition: A | Discharge status: Home / Self Care | Payer: Self-pay | Source: Ambulatory Visit | Attending: Obstetrics and Gynecology

## 2015-01-08 ENCOUNTER — Ambulatory Visit (HOSPITAL_COMMUNITY)
Admission: RE | Admit: 2015-01-08 | Discharge: 2015-01-08 | Disposition: A | Discharge status: Home / Self Care | Payer: BLUE CROSS/BLUE SHIELD | Source: Ambulatory Visit | Attending: Obstetrics and Gynecology | Admitting: Obstetrics and Gynecology

## 2015-01-08 DIAGNOSIS — Z3A12 12 weeks gestation of pregnancy: Secondary | ICD-10-CM | POA: Insufficient documentation

## 2015-01-08 DIAGNOSIS — Z9079 Acquired absence of other genital organ(s): Secondary | ICD-10-CM | POA: Insufficient documentation

## 2015-01-08 DIAGNOSIS — O3431 Maternal care for cervical incompetence, first trimester: Secondary | ICD-10-CM | POA: Insufficient documentation

## 2015-01-08 DIAGNOSIS — O24111 Pre-existing diabetes mellitus, type 2, in pregnancy, first trimester: Secondary | ICD-10-CM | POA: Diagnosis not present

## 2015-01-08 DIAGNOSIS — O09299 Supervision of pregnancy with other poor reproductive or obstetric history, unspecified trimester: Secondary | ICD-10-CM

## 2015-01-08 DIAGNOSIS — Z9889 Other specified postprocedural states: Secondary | ICD-10-CM | POA: Insufficient documentation

## 2015-01-08 DIAGNOSIS — O99211 Obesity complicating pregnancy, first trimester: Secondary | ICD-10-CM | POA: Diagnosis not present

## 2015-01-08 DIAGNOSIS — N736 Female pelvic peritoneal adhesions (postinfective): Secondary | ICD-10-CM | POA: Insufficient documentation

## 2015-01-08 DIAGNOSIS — O26891 Other specified pregnancy related conditions, first trimester: Secondary | ICD-10-CM | POA: Insufficient documentation

## 2015-01-08 DIAGNOSIS — Z79899 Other long term (current) drug therapy: Secondary | ICD-10-CM | POA: Insufficient documentation

## 2015-01-08 DIAGNOSIS — K429 Umbilical hernia without obstruction or gangrene: Secondary | ICD-10-CM | POA: Insufficient documentation

## 2015-01-08 DIAGNOSIS — O09291 Supervision of pregnancy with other poor reproductive or obstetric history, first trimester: Secondary | ICD-10-CM

## 2015-01-08 HISTORY — PX: CERCLAGE LAPAROSCOPIC ABDOMINAL: SHX5769

## 2015-01-08 LAB — CBC
HCT: 36 % (ref 36.0–46.0)
Hemoglobin: 12.1 g/dL (ref 12.0–15.0)
MCH: 29.6 pg (ref 26.0–34.0)
MCHC: 33.6 g/dL (ref 30.0–36.0)
MCV: 88 fL (ref 78.0–100.0)
Platelets: 260 10*3/uL (ref 150–400)
RBC: 4.09 MIL/uL (ref 3.87–5.11)
RDW: 13.7 % (ref 11.5–15.5)
WBC: 7.9 10*3/uL (ref 4.0–10.5)

## 2015-01-08 SURGERY — CERCLAGE, CERVIX, LAPAROSCOPIC
Anesthesia: General

## 2015-01-08 MED ORDER — FENTANYL CITRATE 0.05 MG/ML IJ SOLN
INTRAMUSCULAR | Status: AC
  Filled 2015-01-08: qty 5

## 2015-01-08 MED ORDER — PHENYLEPHRINE HCL 10 MG/ML IJ SOLN
INTRAMUSCULAR | Status: AC
  Filled 2015-01-08: qty 1

## 2015-01-08 MED ORDER — NEOSTIGMINE METHYLSULFATE 10 MG/10ML IV SOLN
INTRAVENOUS | Status: AC
  Filled 2015-01-08: qty 1

## 2015-01-08 MED ORDER — MIDAZOLAM HCL 2 MG/2ML IJ SOLN
INTRAMUSCULAR | Status: AC
  Filled 2015-01-08: qty 2

## 2015-01-08 MED ORDER — LACTATED RINGERS IV SOLN
INTRAVENOUS | Status: DC
  Administered 2015-01-08 (×2): via INTRAVENOUS

## 2015-01-08 MED ORDER — BUPIVACAINE HCL 0.25 % IJ SOLN
INTRAMUSCULAR | Status: DC | PRN
  Administered 2015-01-08: 30 mL

## 2015-01-08 MED ORDER — PHENYLEPHRINE HCL 10 MG/ML IJ SOLN
INTRAMUSCULAR | Status: DC | PRN
  Administered 2015-01-08 (×3): 80 ug via INTRAVENOUS
  Administered 2015-01-08: 40 ug via INTRAVENOUS
  Administered 2015-01-08: 80 ug via INTRAVENOUS

## 2015-01-08 MED ORDER — FENTANYL CITRATE 0.05 MG/ML IJ SOLN
INTRAMUSCULAR | Status: AC
  Filled 2015-01-08: qty 2

## 2015-01-08 MED ORDER — OXYCODONE-ACETAMINOPHEN 7.5-325 MG PO TABS: 1.0000 | ORAL_TABLET | ORAL | Status: DC | PRN

## 2015-01-08 MED ORDER — SCOPOLAMINE 1 MG/3DAYS TD PT72: 1.0000 | MEDICATED_PATCH | Freq: Once | TRANSDERMAL | Status: DC

## 2015-01-08 MED ORDER — KETOROLAC TROMETHAMINE 30 MG/ML IJ SOLN
INTRAMUSCULAR | Status: AC
  Filled 2015-01-08: qty 1

## 2015-01-08 MED ORDER — FENTANYL CITRATE 0.05 MG/ML IJ SOLN
INTRAMUSCULAR | Status: DC | PRN
  Administered 2015-01-08 (×2): 50 ug via INTRAVENOUS
  Administered 2015-01-08: 100 ug via INTRAVENOUS
  Administered 2015-01-08 (×4): 50 ug via INTRAVENOUS

## 2015-01-08 MED ORDER — PHENYLEPHRINE 40 MCG/ML (10ML) SYRINGE FOR IV PUSH (FOR BLOOD PRESSURE SUPPORT)
PREFILLED_SYRINGE | INTRAVENOUS | Status: AC
  Filled 2015-01-08: qty 10

## 2015-01-08 MED ORDER — FENTANYL CITRATE 0.05 MG/ML IJ SOLN
25.0000 ug | INTRAMUSCULAR | Status: DC | PRN
  Administered 2015-01-08: 50 ug via INTRAVENOUS

## 2015-01-08 MED ORDER — ONDANSETRON HCL 4 MG/2ML IJ SOLN
INTRAMUSCULAR | Status: AC
  Filled 2015-01-08: qty 2

## 2015-01-08 MED ORDER — GLYCOPYRROLATE 0.2 MG/ML IJ SOLN
INTRAMUSCULAR | Status: AC
  Filled 2015-01-08: qty 3

## 2015-01-08 MED ORDER — DEXTROSE 5 % IV SOLN
10.0000 mg | INTRAVENOUS | Status: DC | PRN
  Administered 2015-01-08: 20 ug/min via INTRAVENOUS

## 2015-01-08 MED ORDER — BUPIVACAINE HCL (PF) 0.25 % IJ SOLN
INTRAMUSCULAR | Status: AC
  Filled 2015-01-08: qty 30

## 2015-01-08 MED ORDER — ROCURONIUM BROMIDE 100 MG/10ML IV SOLN
INTRAVENOUS | Status: AC
  Filled 2015-01-08: qty 1

## 2015-01-08 MED ORDER — CEFAZOLIN SODIUM-DEXTROSE 2-3 GM-% IV SOLR
INTRAVENOUS | Status: AC
  Filled 2015-01-08: qty 50

## 2015-01-08 MED ORDER — ROCURONIUM BROMIDE 100 MG/10ML IV SOLN
INTRAVENOUS | Status: DC | PRN
  Administered 2015-01-08: 30 mg via INTRAVENOUS

## 2015-01-08 MED ORDER — LIDOCAINE HCL (CARDIAC) 20 MG/ML IV SOLN
INTRAVENOUS | Status: AC
  Filled 2015-01-08: qty 5

## 2015-01-08 MED ORDER — FENTANYL CITRATE 0.05 MG/ML IJ SOLN
INTRAMUSCULAR | Status: AC
  Administered 2015-01-08: 50 ug via INTRAVENOUS
  Filled 2015-01-08: qty 2

## 2015-01-08 MED ORDER — PROPOFOL 10 MG/ML IV BOLUS
INTRAVENOUS | Status: DC | PRN
  Administered 2015-01-08: 200 mg via INTRAVENOUS

## 2015-01-08 MED ORDER — SCOPOLAMINE 1 MG/3DAYS TD PT72
MEDICATED_PATCH | TRANSDERMAL | Status: AC
  Filled 2015-01-08: qty 1

## 2015-01-08 MED ORDER — ONDANSETRON HCL 4 MG/2ML IJ SOLN
INTRAMUSCULAR | Status: DC | PRN
  Administered 2015-01-08: 4 mg via INTRAVENOUS

## 2015-01-08 MED ORDER — ONDANSETRON HCL 4 MG PO TABS: 4.0000 mg | ORAL_TABLET | Freq: Three times a day (TID) | ORAL | Status: DC | PRN

## 2015-01-08 MED ORDER — INDOMETHACIN 25 MG PO CAPS: 25.0000 mg | ORAL_CAPSULE | Freq: Three times a day (TID) | ORAL | Status: DC

## 2015-01-08 MED ORDER — CEFAZOLIN SODIUM-DEXTROSE 2-3 GM-% IV SOLR: 2.0000 g | INTRAVENOUS | Status: DC

## 2015-01-08 MED ORDER — PROPOFOL 10 MG/ML IV BOLUS
INTRAVENOUS | Status: AC
  Filled 2015-01-08: qty 20

## 2015-01-08 MED ORDER — DEXAMETHASONE SODIUM PHOSPHATE 4 MG/ML IJ SOLN
INTRAMUSCULAR | Status: AC
  Filled 2015-01-08: qty 1

## 2015-01-08 MED ORDER — INDOMETHACIN 50 MG RE SUPP
50.0000 mg | Freq: Once | RECTAL | Status: AC
  Administered 2015-01-08: 50 mg via RECTAL
  Filled 2015-01-08: qty 1

## 2015-01-08 MED ORDER — LIDOCAINE HCL (CARDIAC) 20 MG/ML IV SOLN
INTRAVENOUS | Status: DC | PRN
  Administered 2015-01-08: 50 mg via INTRAVENOUS

## 2015-01-08 MED ORDER — ONDANSETRON HCL 4 MG/2ML IJ SOLN: 4.0000 mg | Freq: Once | INTRAMUSCULAR | Status: DC | PRN

## 2015-01-08 MED ORDER — LACTATED RINGERS IR SOLN
Status: DC | PRN
  Administered 2015-01-08: 3000 mL

## 2015-01-08 MED ORDER — PHENYLEPHRINE 40 MCG/ML (10ML) SYRINGE FOR IV PUSH (FOR BLOOD PRESSURE SUPPORT)
PREFILLED_SYRINGE | INTRAVENOUS | Status: AC
  Filled 2015-01-08: qty 20

## 2015-01-08 SURGICAL SUPPLY — 49 items
APPLIER CLIP 5 13 M/L LIGAMAX5 (MISCELLANEOUS) ×3
CABLE HIGH FREQUENCY MONO STRZ (ELECTRODE) ×3 IMPLANT
CATH ROBINSON RED A/P 16FR (CATHETERS) IMPLANT
CLIP APPLIE 5 13 M/L LIGAMAX5 (MISCELLANEOUS) ×1 IMPLANT
CLOTH BEACON ORANGE TIMEOUT ST (SAFETY) ×3 IMPLANT
CORD ACTIVE DISPOSABLE (ELECTRODE) ×2
CORD ELECTRO ACTIVE DISP (ELECTRODE) ×1 IMPLANT
DEVICE TROCAR PUNCTURE CLOSURE (ENDOMECHANICALS) ×12 IMPLANT
DRESSING OPSITE X SMALL 2X3 (GAUZE/BANDAGES/DRESSINGS) ×3 IMPLANT
DRSG COVADERM PLUS 2X2 (GAUZE/BANDAGES/DRESSINGS) ×6 IMPLANT
DRSG OPSITE POSTOP 3X4 (GAUZE/BANDAGES/DRESSINGS) IMPLANT
DURAPREP 26ML APPLICATOR (WOUND CARE) ×3 IMPLANT
ELECT NEEDLE TIP 2.8 STRL (NEEDLE) IMPLANT
ELECT REM PT RETURN 9FT ADLT (ELECTROSURGICAL) ×3
ELECTRODE REM PT RTRN 9FT ADLT (ELECTROSURGICAL) ×1 IMPLANT
EVACUATOR SMOKE 8.L (FILTER) ×3 IMPLANT
GLOVE BIO SURGEON STRL SZ8 (GLOVE) ×3 IMPLANT
GLOVE BIOGEL PI IND STRL 8.5 (GLOVE) ×1 IMPLANT
GLOVE BIOGEL PI INDICATOR 8.5 (GLOVE) ×2
GOWN STRL REUS W/TWL LRG LVL3 (GOWN DISPOSABLE) ×9 IMPLANT
LIQUID BAND (GAUZE/BANDAGES/DRESSINGS) ×3 IMPLANT
NEEDLE HYPO 22GX1.5 SAFETY (NEEDLE) ×3 IMPLANT
PACK LAPAROSCOPY BASIN (CUSTOM PROCEDURE TRAY) ×3 IMPLANT
PAD POSITIONER PINK NONSTERILE (MISCELLANEOUS) ×3 IMPLANT
PENCIL BUTTON HOLSTER BLD 10FT (ELECTRODE) IMPLANT
RETRACTOR LAPSCP 12X46 CVD (ENDOMECHANICALS) ×2 IMPLANT
RTRCTR LAPSCP 12X46 CVD (ENDOMECHANICALS) ×6
SEALER TISSUE G2 CVD JAW 35 (ENDOMECHANICALS) IMPLANT
SEALER TISSUE G2 CVD JAW 45CM (ENDOMECHANICALS)
SEPRAFILM MEMBRANE 5X6 (MISCELLANEOUS) IMPLANT
SET IRRIG TUBING LAPAROSCOPIC (IRRIGATION / IRRIGATOR) ×3 IMPLANT
SHEARS HARMONIC ACE PLUS 36CM (ENDOMECHANICALS) ×3 IMPLANT
SLEEVE XCEL OPT CAN 5 100 (ENDOMECHANICALS) ×3 IMPLANT
SUT MERSILENE 5MM BP 1 12 (SUTURE) ×3 IMPLANT
SUT PROLENE 0 CT 1 30 (SUTURE) IMPLANT
SUT SILK 2 0 FS (SUTURE) ×3 IMPLANT
SUT SILK 2 0 FSL 18 (SUTURE) ×6 IMPLANT
SUT VIC AB 2-0 UR6 27 (SUTURE) IMPLANT
SUT VICRYL 0 TIES 12 18 (SUTURE) IMPLANT
SYR 20CC LL (SYRINGE) IMPLANT
SYR 50ML LL SCALE MARK (SYRINGE) IMPLANT
SYR CONTROL 10ML LL (SYRINGE) ×3 IMPLANT
SYR TOOMEY 50ML (SYRINGE) IMPLANT
TOWEL OR 17X24 6PK STRL BLUE (TOWEL DISPOSABLE) ×6 IMPLANT
TRAY FOLEY CATH 14FR (SET/KITS/TRAYS/PACK) ×3 IMPLANT
TROCAR OPTI TIP 5M 100M (ENDOMECHANICALS) ×3 IMPLANT
TROCAR XCEL DIL TIP R 11M (ENDOMECHANICALS) ×3 IMPLANT
WARMER LAPAROSCOPE (MISCELLANEOUS) ×3 IMPLANT
WATER STERILE IRR 1000ML POUR (IV SOLUTION) ×3 IMPLANT

## 2015-01-08 NOTE — Op Note (Signed)
OPERATIVE NOTE  Preoperative diagnosis: Cervical insufficiency, intrauterine pregnancy at 12 weeks 5 days  Postoperative diagnosis: Cervical insufficiency, intrauterine pregnancy at 12 weeks 5 days, pelvic adhesions  Procedure: Laparoscopy, transabdominal cervical isthmic cerclage, intraoperative ultrasound guidance  Anesthesia: Gen. Endotracheal  Surgeon:Kayleana Waites April MansonYalcinkaya, MD  Complications: None  Estimated blood loss: 100 cc  Findings: On exam under anesthesia the cervix was closed.  On laparoscopy there was a large amount of omental adhesions to the midline anterior abdominal wall, and these were lysed. There were fundal epiploic adhesions which were lysed. The liver liver and gallbladder were within normal limits. An umbilical hernia in formation was noted but not repaired. The appendix was not visualized. The uterus was gravid and both ovaries appeared enlarged. The left tube had filmy adhesions to the epiploic appendix.  Intraoperative ultrasound transabdominally and transvaginally showed a singleton intrauterine pregnancy. There was fetal cardiac activity and movements at the completion of the procedure. After the cerclage, the length of the cervix was 3.85 cm, the AP diameter of the cerclage was 1.9 cm and the transverse diameter was 1.99 cm.  Description of the procedure: The patient was placed in dorsal supine position and general endotracheal anesthesia was given. A suppository of indomethacin 50 mg was placed rectally. She was then placed in lithotomy position and the abdomen was prepped and draped inside manner. A Foley catheter was inserted into the bladder. An intraumbilical 5 mm vertical skin incision was made  after preemptive anesthesia of planned incisions with 0.25% bupivacaine. A Verress needle was inserted. A pneumoperitoneum was created with carbon dioxide. A 5 mm trocar and then a corresponding laparoscope with 30 angle was inserted and video laparoscopy was started.   Under direct visualization 2 more 5 mm lower quadrant incisions were made and corresponding trochars were placed. A fifth 12 mm trocar was placed in the left upper quadrant for the Endopaddle retractor.  Above findings were noted. A #5 Mersilene tape was prepared by cutting the needles off just at the swaged and and creating a loop of 2-0 silk at each end to allow carrying the end of the Mersilene tape through the tissue during the cerclage. This Mersilene tape was then dropped into the posterior cul-de-sac. The patient was placed in Trendelenburg position and the uterus was gently pressed posteriorly and superiorly with the EndoPaddle retractor while the bladder flap was created with the electrosurgical needle, set at 35 W cutting current. After blunt and sharp dissection the cervico-isthmic junction could be well visualized. Bleeding in a branch of the left uterine artery was controlled by application of vascular clips.  Next a stab wound incision was made 1 cm from the midline immediately suprapubically and an Endo Close ligature carrier was passed through the abdominal wall into the pelvis and then passed through the right side of the cervical isthmic junction. At this point of passing the tip of the Endo Close through the parametrium the uterus was hammocked on the EndoPaddle device and lifted anteriorly, allowing visualization of the posterior lower uterine segment. The tip of the Endo Close device was brought out posteriorly just above the medial insertion point of the right uterosacral ligaments onto the uterus. 1 end of the Mersilene tape was grasped with the Endo Close device and brought out anteriorly. The same steps were repeated with a new Endo Close device on the left anterior aspect of the cervico-isthmic junction, immediately medial to the uterine blood vessels, and the other end of the Mersilene tape was grasped with  the ligature carrier device and brought out anteriorly. Intraoperative  transvaginal ultrasound guidance confirmed correct extra chorionic placement of the cerclage, as well as the fetal viability, even though he noted that the right side of the stitch was about a centimeter higher, incorporating a portion of myometrium. When the cerclage suture was tied with a surgeon's knot followed by 4 additional knots under transvaginal ultrasound guidance again the correct placement was confirmed. The resulting length of the cervix was 3.85 cm, the AP diameter was 1.9 cm and the transverse diameter was 1.99 cm. The ends of the Mersilene tape was trimmed off and removed from the pelvis. Good hemostasis was insured. The pelvis was copiously irrigated and aspirated. The gas was allowed to escape. A 0 Vicryl deep subcutaneous suture was placed on the 12 mm incision. All skin incisions were approximated with Dermabond. The patient tolerated the procedure well and was transferred to recovery room in satisfactory condition. She will use indomethacin 25 mg by mouth 3 times a day for the next 3 days prophylactically, to reduce uterine irritability.  Fermin Schwab

## 2015-01-08 NOTE — Anesthesia Procedure Notes (Signed)
Procedure Name: Intubation Date/Time: 01/08/2015 11:09 AM Performed by: Elgie CongoMALINOVA, Juliah Scadden H Pre-anesthesia Checklist: Patient identified, Emergency Drugs available, Suction available and Patient being monitored Patient Re-evaluated:Patient Re-evaluated prior to inductionOxygen Delivery Method: Circle system utilized Preoxygenation: Pre-oxygenation with 100% oxygen Intubation Type: IV induction Ventilation: Mask ventilation without difficulty and Oral airway inserted - appropriate to patient size Laryngoscope Size: Mac and 3 Grade View: Grade II Tube type: Oral Tube size: 7.0 mm Number of attempts: 1 Airway Equipment and Method: Stylet Placement Confirmation: ETT inserted through vocal cords under direct vision,  positive ETCO2 and breath sounds checked- equal and bilateral Secured at: 22 cm Tube secured with: Tape Dental Injury: Teeth and Oropharynx as per pre-operative assessment

## 2015-01-08 NOTE — Progress Notes (Signed)
Reported per OR nurse L. Gerome ApleyHartley, that US guidance was used and FHR was determined.  Stated FHR "between 140-145"

## 2015-01-08 NOTE — Transfer of Care (Signed)
Immediate Anesthesia Transfer of Care Note  Patient: Penny [name]  Procedure(s) Performed: Procedure(s): CERCLAGE LAPAROSCOPIC ABDOMINAL, LAPAROSCOPIC LYSIS OF ADHESIONS, INTRAOPERATIVE TRANSVAGINAL ULTRASOUND GUIDANCE (N/A)  Patient Location: PACU  Anesthesia Type:General  Level of Consciousness: awake and sedated  Airway & Oxygen Therapy: Patient Spontanous Breathing and Patient connected to nasal cannula oxygen  Post-op Assessment: Report given to RN, Post -op Vital signs reviewed and stable and Patient moving all extremities  Post vital signs: Reviewed and stable  Last Vitals:  Filed Vitals:   01/08/15 0931  BP: 132/84  Pulse: 71  Temp: 37 C  Resp: 18    Complications: No apparent anesthesia complications

## 2015-01-08 NOTE — H&P (Signed)
History and Physical Interval Note:  01/08/2015 10:54 AM  Penny [name]  has presented today for surgery, with the diagnosis of INCOMPETENT CERVIX  The various methods of treatment have been discussed with the patient and family. After consideration of risks, benefits and other options for treatment, the patient has consented to  Procedure(s): CERCLAGE LAPAROSCOPIC ABDOMINAL (N/A) as a surgical intervention .  The patient's history has been reviewed, patient examined, no change in status, stable for surgery.  I have reviewed the patient's chart and labs.  Questions were answered to the patient's satisfaction.     Fermin SchwabYALCINKAYA,Devaris Quirk

## 2015-01-08 NOTE — Anesthesia Postprocedure Evaluation (Signed)
  Anesthesia Post-op Note  Patient: Penny [name]  Procedure(s) Performed: Procedure(s): CERCLAGE LAPAROSCOPIC ABDOMINAL, LAPAROSCOPIC LYSIS OF ADHESIONS, INTRAOPERATIVE TRANSVAGINAL ULTRASOUND GUIDANCE (N/A)  Patient Location: PACU  Anesthesia Type:General  Level of Consciousness: awake, alert  and oriented  Airway and Oxygen Therapy: Patient Spontanous Breathing  Post-op Pain: mild  Post-op Assessment: Post-op Vital signs reviewed, Patient's Cardiovascular Status Stable, Respiratory Function Stable, Patent Airway, No signs of Nausea or vomiting and Pain level controlled  Post-op Vital Signs: Reviewed and stable  Last Vitals:  Filed Vitals:   01/08/15 1500  BP: 127/77  Pulse: 72  Temp:   Resp: 12    Complications: No apparent anesthesia complications

## 2015-01-08 NOTE — Discharge Instructions (Signed)
DISCHARGE INSTRUCTIONS: Laparoscopy  The following instructions have been prepared to help you care for yourself upon your return home today.  Wound care:  Do not get the incision wet for the first 24 hours. The incision should be kept clean and dry.  The Band-Aids or dressings may be removed the day after surgery.  Should the incision become sore, red, and swollen after the first week, check with your doctor.  Personal hygiene:  Shower the day after your procedure.  Activity and limitations:  Do NOT drive or operate any equipment today.  Do NOT lift anything more than 15 pounds for 2-3 weeks after surgery.  Do NOT rest in bed all day.  Walking is encouraged. Walk each day, starting slowly with 5-minute walks 3 or 4 times a day. Slowly increase the length of your walks.  Walk up and down stairs slowly.  Do NOT do strenuous activities, such as golfing, playing tennis, bowling, running, biking, weight lifting, gardening, mowing, or vacuuming for 2-4 weeks. Ask your doctor when it is okay to start.  Diet: Eat a light meal as desired this evening. You may resume your usual diet tomorrow.  Return to work: This is dependent on the type of work you do. For the most part you can return to a desk job within a week of surgery. If you are more active at work, please discuss this with your doctor.  What to expect after your surgery: You may have a slight burning sensation when you urinate on the first day. You may have a very small amount of blood in the urine. Expect to have a small amount of vaginal discharge/light bleeding for 1-2 weeks. It is not unusual to have abdominal soreness and bruising for up to 2 weeks. You may be tired and need more rest for about 1 week. You may experience shoulder pain for 24-72 hours. Lying flat in bed may relieve it.  Call your doctor for any of the following:  Develop a fever of 100.4 or greater  Inability to urinate 6 hours after discharge from  hospital  Severe pain not relieved by pain medications  Persistent of heavy bleeding at incision site  Redness or swelling around incision site after a week  Increasing nausea or vomiting  Patient Signature________________________________________ Nurse Signature_________________________________________   Cervical Cerclage, Care After  Refer to this sheet in the next few weeks. These instructions provide you with information on caring for yourself after your procedure. Your health care provider may also give you more specific instructions. Your treatment has been planned according to current medical practices, but problems sometimes occur. Call your health care provider if you have any problems or questions after your procedure.  WHAT TO EXPECT AFTER THE PROCEDURE  After your procedure, it is typical to have the following:  Abdominal cramping.  Vaginal spotting. HOME CARE INSTRUCTIONS  Only take over-the-counter or prescription medicines for pain, discomfort, or fever as directed by your health care provider.  Avoid physical activities and exercise until your health care provider says it is okay.  Do not douche or have sexual intercourse until your health care provider tells you it is okay.  Keep your follow-up surgical and prenatal appointments with your health care provider. SEEK MEDICAL CARE IF:  You have abnormal vaginal discharge.  You have a rash.  You become lightheaded or feel faint.  You have abdominal pain that is not controlled with pain medicine. SEEK IMMEDIATE MEDICAL CARE IF:  You develop vaginal bleeding.  You are leaking fluid or have a gush of fluid from the vagina.  You have a fever.  You faint.  You have uterine contractions.  You feel your baby is not moving as much as usual, or you cannot feel your baby move.  You have chest pain or shortness of breath. Document Released: 07/24/2013 Document Revised: 10/08/2013 Document Reviewed: 07/24/2013  [facility]  Patient Information 2015 [employer], [place]. This information is not intended to replace advice given to you by your health care provider. Make sure you discuss any questions you have with your health care provider.

## 2015-01-09 ENCOUNTER — Encounter (HOSPITAL_COMMUNITY): Payer: Self-pay | Admitting: Obstetrics and Gynecology

## 2015-06-01 ENCOUNTER — Other Ambulatory Visit: Payer: Self-pay | Admitting: Obstetrics and Gynecology

## 2015-06-26 ENCOUNTER — Inpatient Hospital Stay (HOSPITAL_COMMUNITY): Payer: BLUE CROSS/BLUE SHIELD | Admitting: Anesthesiology

## 2015-06-26 ENCOUNTER — Encounter (HOSPITAL_COMMUNITY)
Admission: RE | Admit: 2015-06-26 | Discharge: 2015-06-26 | Disposition: A | Discharge status: Home / Self Care | Payer: BLUE CROSS/BLUE SHIELD | Source: Ambulatory Visit | Attending: Obstetrics and Gynecology | Admitting: Obstetrics and Gynecology

## 2015-06-26 ENCOUNTER — Inpatient Hospital Stay (HOSPITAL_COMMUNITY)
Admission: AD | Admit: 2015-06-26 | Discharge: 2015-06-29 | DRG: 765 | Disposition: A | Discharge status: Home / Self Care | Payer: BLUE CROSS/BLUE SHIELD | Source: Ambulatory Visit | Attending: Obstetrics and Gynecology | Admitting: Obstetrics and Gynecology

## 2015-06-26 ENCOUNTER — Encounter (HOSPITAL_COMMUNITY): Payer: Self-pay | Admitting: *Deleted

## 2015-06-26 ENCOUNTER — Encounter (HOSPITAL_COMMUNITY): Payer: Self-pay

## 2015-06-26 ENCOUNTER — Encounter (HOSPITAL_COMMUNITY)
Admission: AD | Disposition: A | Discharge status: Home / Self Care | Payer: Self-pay | Source: Ambulatory Visit | Attending: Obstetrics and Gynecology

## 2015-06-26 DIAGNOSIS — O3430 Maternal care for cervical incompetence, unspecified trimester: Secondary | ICD-10-CM | POA: Diagnosis present

## 2015-06-26 DIAGNOSIS — O99284 Endocrine, nutritional and metabolic diseases complicating childbirth: Secondary | ICD-10-CM | POA: Diagnosis not present

## 2015-06-26 DIAGNOSIS — Z3490 Encounter for supervision of normal pregnancy, unspecified, unspecified trimester: Secondary | ICD-10-CM

## 2015-06-26 DIAGNOSIS — O9902 Anemia complicating childbirth: Secondary | ICD-10-CM | POA: Diagnosis present

## 2015-06-26 DIAGNOSIS — E876 Hypokalemia: Secondary | ICD-10-CM | POA: Diagnosis not present

## 2015-06-26 DIAGNOSIS — O1490 Unspecified pre-eclampsia, unspecified trimester: Secondary | ICD-10-CM | POA: Diagnosis present

## 2015-06-26 DIAGNOSIS — D62 Acute posthemorrhagic anemia: Secondary | ICD-10-CM | POA: Diagnosis present

## 2015-06-26 DIAGNOSIS — O3421 Maternal care for scar from previous cesarean delivery: Secondary | ICD-10-CM | POA: Diagnosis present

## 2015-06-26 DIAGNOSIS — E282 Polycystic ovarian syndrome: Secondary | ICD-10-CM | POA: Diagnosis present

## 2015-06-26 DIAGNOSIS — O874 Varicose veins of lower extremity in the puerperium: Secondary | ICD-10-CM | POA: Diagnosis present

## 2015-06-26 DIAGNOSIS — O1413 Severe pre-eclampsia, third trimester: Principal | ICD-10-CM | POA: Diagnosis present

## 2015-06-26 DIAGNOSIS — O09523 Supervision of elderly multigravida, third trimester: Secondary | ICD-10-CM | POA: Diagnosis not present

## 2015-06-26 DIAGNOSIS — Z3A36 36 weeks gestation of pregnancy: Secondary | ICD-10-CM | POA: Diagnosis present

## 2015-06-26 HISTORY — DX: Other specified postprocedural states: Z98.890

## 2015-06-26 HISTORY — DX: Acquired absence of other genital organ(s): Z90.79

## 2015-06-26 LAB — CBC
HCT: 31.6 % — ABNORMAL LOW (ref 36.0–46.0)
HEMATOCRIT: 31.4 % — AB (ref 36.0–46.0)
HEMOGLOBIN: 10.5 g/dL — AB (ref 12.0–15.0)
Hemoglobin: 10.6 g/dL — ABNORMAL LOW (ref 12.0–15.0)
MCH: 30.5 pg (ref 26.0–34.0)
MCH: 30.4 pg (ref 26.0–34.0)
MCHC: 33.5 g/dL (ref 30.0–36.0)
MCHC: 33.4 g/dL (ref 30.0–36.0)
MCV: 91.1 fL (ref 78.0–100.0)
MCV: 91 fL (ref 78.0–100.0)
PLATELETS: 195 10*3/uL (ref 150–400)
Platelets: 202 10*3/uL (ref 150–400)
RBC: 3.47 MIL/uL — AB (ref 3.87–5.11)
RBC: 3.45 MIL/uL — AB (ref 3.87–5.11)
RDW: 14.9 % (ref 11.5–15.5)
RDW: 14.9 % (ref 11.5–15.5)
WBC: 6.9 10*3/uL (ref 4.0–10.5)
WBC: 7.4 10*3/uL (ref 4.0–10.5)

## 2015-06-26 LAB — COMPREHENSIVE METABOLIC PANEL
ALBUMIN: 3.6 g/dL (ref 3.5–5.0)
ALT: 119 U/L — ABNORMAL HIGH (ref 14–54)
ANION GAP: 8 (ref 5–15)
AST: 108 U/L — ABNORMAL HIGH (ref 15–41)
Alkaline Phosphatase: 182 U/L — ABNORMAL HIGH (ref 38–126)
BUN: 6 mg/dL (ref 6–20)
CHLORIDE: 106 mmol/L (ref 101–111)
CO2: 26 mmol/L (ref 22–32)
Calcium: 8.9 mg/dL (ref 8.9–10.3)
Creatinine, Ser: 0.44 mg/dL (ref 0.44–1.00)
GFR calc Af Amer: >60 mL/min (ref >60)
GFR calc non Af Amer: >60 mL/min (ref >60)
GLUCOSE: 101 mg/dL — AB (ref 65–99)
POTASSIUM: 2.8 mmol/L — AB (ref 3.5–5.1)
SODIUM: 140 mmol/L (ref 135–145)
Total Bilirubin: 1 mg/dL (ref 0.3–1.2)
Total Protein: 6.2 g/dL — ABNORMAL LOW (ref 6.5–8.1)

## 2015-06-26 LAB — PREPARE RBC (CROSSMATCH)

## 2015-06-26 SURGERY — Surgical Case
Anesthesia: Spinal

## 2015-06-26 MED ORDER — SIMETHICONE 80 MG PO CHEW
80.0000 mg | CHEWABLE_TABLET | Freq: Three times a day (TID) | ORAL | Status: DC
  Administered 2015-06-27 – 2015-06-28 (×5): 80 mg via ORAL
  Filled 2015-06-26 (×6): qty 1

## 2015-06-26 MED ORDER — WITCH HAZEL-GLYCERIN EX PADS: 1.0000 "application " | MEDICATED_PAD | CUTANEOUS | Status: DC | PRN

## 2015-06-26 MED ORDER — IBUPROFEN 600 MG PO TABS
600.0000 mg | ORAL_TABLET | Freq: Four times a day (QID) | ORAL | Status: DC
  Administered 2015-06-27 – 2015-06-29 (×10): 600 mg via ORAL
  Filled 2015-06-26 (×10): qty 1

## 2015-06-26 MED ORDER — ZOLPIDEM TARTRATE 5 MG PO TABS: 5.0000 mg | ORAL_TABLET | Freq: Every evening | ORAL | Status: DC | PRN

## 2015-06-26 MED ORDER — MEPERIDINE HCL 25 MG/ML IJ SOLN: 6.2500 mg | INTRAMUSCULAR | Status: DC | PRN

## 2015-06-26 MED ORDER — SODIUM BICARBONATE 8.4 % IV SOLN
INTRAVENOUS | Status: DC | PRN
  Administered 2015-06-26: 4 mL via EPIDURAL
  Administered 2015-06-26: 2 mL via EPIDURAL
  Administered 2015-06-26: 5 mL via EPIDURAL
  Administered 2015-06-26: 4 mL via EPIDURAL

## 2015-06-26 MED ORDER — OXYTOCIN 40 UNITS IN LACTATED RINGERS INFUSION - SIMPLE MED: 62.5000 mL/h | INTRAVENOUS | Status: DC

## 2015-06-26 MED ORDER — LIDOCAINE HCL (PF) 1 % IJ SOLN: 30.0000 mL | INTRAMUSCULAR | Status: DC | PRN

## 2015-06-26 MED ORDER — MAGNESIUM SULFATE 50 % IJ SOLN
2.0000 g/h | INTRAVENOUS | Status: DC
  Administered 2015-06-26: 2 g/h via INTRAVENOUS
  Administered 2015-06-26: 4 g/h via INTRAVENOUS
  Administered 2015-06-27: 2 g/h via INTRAVENOUS
  Filled 2015-06-26 (×2): qty 80

## 2015-06-26 MED ORDER — SENNOSIDES-DOCUSATE SODIUM 8.6-50 MG PO TABS
2.0000 | ORAL_TABLET | ORAL | Status: DC
  Administered 2015-06-27 – 2015-06-28 (×3): 2 via ORAL
  Filled 2015-06-26 (×3): qty 2

## 2015-06-26 MED ORDER — MORPHINE SULFATE 0.5 MG/ML IJ SOLN
INTRAMUSCULAR | Status: AC
  Filled 2015-06-26: qty 10

## 2015-06-26 MED ORDER — PHENYLEPHRINE 8 MG IN D5W 100 ML (0.08MG/ML) PREMIX OPTIME
INJECTION | INTRAVENOUS | Status: DC | PRN
  Administered 2015-06-26: 30 ug/min via INTRAVENOUS

## 2015-06-26 MED ORDER — SIMETHICONE 80 MG PO CHEW: 80.0000 mg | CHEWABLE_TABLET | ORAL | Status: DC | PRN

## 2015-06-26 MED ORDER — DIBUCAINE 1 % RE OINT: 1.0000 "application " | TOPICAL_OINTMENT | RECTAL | Status: DC | PRN

## 2015-06-26 MED ORDER — LACTATED RINGERS IV SOLN
INTRAVENOUS | Status: DC
  Administered 2015-06-27: 11:00:00 via INTRAVENOUS

## 2015-06-26 MED ORDER — ACETAMINOPHEN 325 MG PO TABS: 650.0000 mg | ORAL_TABLET | ORAL | Status: DC | PRN

## 2015-06-26 MED ORDER — BUPIVACAINE LIPOSOME 1.3 % IJ SUSP
20.0000 mL | Freq: Once | INTRAMUSCULAR | Status: DC
  Filled 2015-06-26: qty 20

## 2015-06-26 MED ORDER — CEFAZOLIN SODIUM-DEXTROSE 2-3 GM-% IV SOLR
INTRAVENOUS | Status: DC | PRN
  Administered 2015-06-26: 2 g via INTRAVENOUS

## 2015-06-26 MED ORDER — HYDROMORPHONE HCL 1 MG/ML IJ SOLN: 0.2500 mg | INTRAMUSCULAR | Status: DC | PRN

## 2015-06-26 MED ORDER — SODIUM CHLORIDE 0.9 % IV SOLN: Freq: Once | INTRAVENOUS | Status: DC

## 2015-06-26 MED ORDER — FENTANYL CITRATE (PF) 100 MCG/2ML IJ SOLN
INTRAMUSCULAR | Status: AC
  Filled 2015-06-26: qty 2

## 2015-06-26 MED ORDER — DEXAMETHASONE SODIUM PHOSPHATE 4 MG/ML IJ SOLN
INTRAMUSCULAR | Status: DC | PRN
  Administered 2015-06-26: 4 mg via INTRAVENOUS

## 2015-06-26 MED ORDER — BUPIVACAINE HCL (PF) 0.25 % IJ SOLN
INTRAMUSCULAR | Status: AC
  Filled 2015-06-26: qty 30

## 2015-06-26 MED ORDER — LACTATED RINGERS IV SOLN: 500.0000 mL | INTRAVENOUS | Status: DC | PRN

## 2015-06-26 MED ORDER — SIMETHICONE 80 MG PO CHEW
80.0000 mg | CHEWABLE_TABLET | ORAL | Status: DC
  Administered 2015-06-27 – 2015-06-28 (×3): 80 mg via ORAL
  Filled 2015-06-26 (×3): qty 1

## 2015-06-26 MED ORDER — LANOLIN HYDROUS EX OINT: 1.0000 "application " | TOPICAL_OINTMENT | CUTANEOUS | Status: DC | PRN

## 2015-06-26 MED ORDER — DIPHENHYDRAMINE HCL 25 MG PO CAPS: 25.0000 mg | ORAL_CAPSULE | Freq: Four times a day (QID) | ORAL | Status: DC | PRN

## 2015-06-26 MED ORDER — LIDOCAINE-EPINEPHRINE 2 %-1:100000 IJ SOLN
INTRAMUSCULAR | Status: DC | PRN
  Administered 2015-06-26: 2 mL

## 2015-06-26 MED ORDER — MAGNESIUM SULFATE BOLUS VIA INFUSION
4.0000 g | Freq: Once | INTRAVENOUS | Status: DC
  Filled 2015-06-26: qty 500

## 2015-06-26 MED ORDER — LACTATED RINGERS IV SOLN
40.0000 [IU] | INTRAVENOUS | Status: DC | PRN
  Administered 2015-06-26: 40 [IU] via INTRAVENOUS

## 2015-06-26 MED ORDER — FLEET ENEMA 7-19 GM/118ML RE ENEM: 1.0000 | ENEMA | RECTAL | Status: DC | PRN

## 2015-06-26 MED ORDER — TETANUS-DIPHTH-ACELL PERTUSSIS 5-2.5-18.5 LF-MCG/0.5 IM SUSP
0.5000 mL | Freq: Once | INTRAMUSCULAR | Status: DC
  Filled 2015-06-26: qty 0.5

## 2015-06-26 MED ORDER — LACTATED RINGERS IV SOLN
INTRAVENOUS | Status: DC
  Administered 2015-06-26: 19:00:00 via INTRAVENOUS

## 2015-06-26 MED ORDER — OXYCODONE-ACETAMINOPHEN 5-325 MG PO TABS: 1.0000 | ORAL_TABLET | ORAL | Status: DC | PRN

## 2015-06-26 MED ORDER — OXYCODONE-ACETAMINOPHEN 5-325 MG PO TABS: 2.0000 | ORAL_TABLET | ORAL | Status: DC | PRN

## 2015-06-26 MED ORDER — ONDANSETRON HCL 4 MG/2ML IJ SOLN
4.0000 mg | Freq: Four times a day (QID) | INTRAMUSCULAR | Status: DC | PRN
  Administered 2015-06-26: 4 mg via INTRAVENOUS

## 2015-06-26 MED ORDER — OXYCODONE-ACETAMINOPHEN 5-325 MG PO TABS
1.0000 | ORAL_TABLET | ORAL | Status: DC | PRN
  Administered 2015-06-28 – 2015-06-29 (×4): 1 via ORAL
  Filled 2015-06-26 (×4): qty 1

## 2015-06-26 MED ORDER — PRENATAL MULTIVITAMIN CH
1.0000 | ORAL_TABLET | Freq: Every day | ORAL | Status: DC
  Administered 2015-06-27 – 2015-06-29 (×3): 1 via ORAL
  Filled 2015-06-26 (×3): qty 1

## 2015-06-26 MED ORDER — CITRIC ACID-SODIUM CITRATE 334-500 MG/5ML PO SOLN
30.0000 mL | ORAL | Status: DC | PRN
  Administered 2015-06-26: 30 mL via ORAL
  Filled 2015-06-26: qty 15

## 2015-06-26 MED ORDER — MENTHOL 3 MG MT LOZG
1.0000 | LOZENGE | OROMUCOSAL | Status: DC | PRN
  Filled 2015-06-26: qty 9

## 2015-06-26 MED ORDER — OXYTOCIN BOLUS FROM INFUSION: 500.0000 mL | INTRAVENOUS | Status: DC

## 2015-06-26 SURGICAL SUPPLY — 34 items
BENZOIN TINCTURE PRP APPL 2/3 (GAUZE/BANDAGES/DRESSINGS) ×3 IMPLANT
CLAMP CORD UMBIL (MISCELLANEOUS) IMPLANT
CLOSURE STERI STRIP 1/2 X4 (GAUZE/BANDAGES/DRESSINGS) ×3 IMPLANT
CLOTH BEACON ORANGE TIMEOUT ST (SAFETY) ×3 IMPLANT
CONTAINER PREFILL 10% NBF 15ML (MISCELLANEOUS) IMPLANT
DRAPE SHEET LG 3/4 BI-LAMINATE (DRAPES) IMPLANT
DRSG OPSITE POSTOP 4X10 (GAUZE/BANDAGES/DRESSINGS) ×3 IMPLANT
DURAPREP 26ML APPLICATOR (WOUND CARE) ×3 IMPLANT
ELECT REM PT RETURN 9FT ADLT (ELECTROSURGICAL) ×3
ELECTRODE REM PT RTRN 9FT ADLT (ELECTROSURGICAL) ×1 IMPLANT
EXTRACTOR VACUUM M CUP 4 TUBE (SUCTIONS) IMPLANT
EXTRACTOR VACUUM M CUP 4' TUBE (SUCTIONS)
GLOVE BIO SURGEON STRL SZ7.5 (GLOVE) ×3 IMPLANT
GOWN STRL REUS W/TWL LRG LVL3 (GOWN DISPOSABLE) ×6 IMPLANT
KIT ABG SYR 3ML LUER SLIP (SYRINGE) IMPLANT
NEEDLE HYPO 22GX1.5 SAFETY (NEEDLE) ×3 IMPLANT
NEEDLE HYPO 25X5/8 SAFETYGLIDE (NEEDLE) IMPLANT
NEEDLE SPNL 20GX3.5 QUINCKE YW (NEEDLE) IMPLANT
NS IRRIG 1000ML POUR BTL (IV SOLUTION) ×3 IMPLANT
PACK C SECTION WH (CUSTOM PROCEDURE TRAY) ×3 IMPLANT
PENCIL SMOKE EVAC W/HOLSTER (ELECTROSURGICAL) ×3 IMPLANT
SUT MNCRL 0 VIOLET CTX 36 (SUTURE) ×2 IMPLANT
SUT MNCRL AB 3-0 PS2 27 (SUTURE) IMPLANT
SUT MON AB 2-0 CT1 27 (SUTURE) ×3 IMPLANT
SUT MON AB-0 CT1 36 (SUTURE) ×6 IMPLANT
SUT MONOCRYL 0 CTX 36 (SUTURE) ×4
SUT PLAIN 0 NONE (SUTURE) IMPLANT
SUT PLAIN 2 0 (SUTURE)
SUT PLAIN 2 0 XLH (SUTURE) IMPLANT
SUT PLAIN ABS 2-0 CT1 27XMFL (SUTURE) IMPLANT
SYR 20CC LL (SYRINGE) IMPLANT
SYR CONTROL 10ML LL (SYRINGE) ×3 IMPLANT
TOWEL OR 17X24 6PK STRL BLUE (TOWEL DISPOSABLE) ×3 IMPLANT
TRAY FOLEY CATH SILVER 14FR (SET/KITS/TRAYS/PACK) ×3 IMPLANT

## 2015-06-26 NOTE — Op Note (Signed)
Cesarean Section Procedure Note  Indications: severe preeclampsia  Abdominal cerclage Previous csection  Pre-operative Diagnosis: 36 week 2 day pregnancy.  Post-operative Diagnosis: same  Surgeon: Lenoard Aden   Assistants: Fredric Mare, CNM  Anesthesia: Epidural anesthesia and Local anesthesia 0.25.% bupivacaine  ASA Class: 2  Procedure Details  The patient was seen in the Holding Room. The risks, benefits, complications, treatment options, and expected outcomes were discussed with the patient.  The patient concurred with the proposed plan, giving informed consent. The risks of anesthesia, infection, bleeding and possible injury to other organs discussed. Injury to bowel, bladder, or ureter with possible need for repair discussed. Possible need for transfusion with secondary risks of hepatitis or HIV acquisition discussed. Post operative complications to include but not limited to DVT, PE and Pneumonia noted. The site of surgery properly noted/marked. The patient was taken to Operating Room # 9, identified as Penny [name] and the procedure verified as C-Section Delivery. A Time Out was held and the above information confirmed.  After induction of anesthesia, the patient was draped and prepped in the usual sterile manner. A Pfannenstiel incision was made and carried down through the subcutaneous tissue to the fascia. Fascial incision was made and extended transversely using Mayo scissors. The fascia was separated from the underlying rectus tissue superiorly and inferiorly. The peritoneum was identified and entered. Peritoneal incision was extended longitudinally. The utero-vesical peritoneal reflection was incised transversely and the bladder flap was bluntly freed from the lower uterine segment. A low transverse uterine incision(Kerr hysterotomy) was made. Delivered from OT presentation was a  female with Apgar scores of 8 at one minute and 9 at five minutes. Bulb suctioning gently performed.  Neonatal team in attendance.After the umbilical cord was clamped and cut cord blood was obtained for evaluation. The placenta was removed intact and appeared normal. The uterus was curetted with a dry lap pack. Good hemostasis was noted.The uterine outline, tubes and ovaries appeared normal. The uterine incision was closed with running locked sutures of 0 Monocryl x 2 layers. Interrupted suture in midline to control bleeding from varicosity.  Hemostasis was observed. Lavage was carried out until clear.The parietal peritoneum was closed with a running 2-0 Monocryl suture. The fascia was then reapproximated with running sutures of 0 Monocryl. The skin was reapproximated with 3-0 monocryl after McEwen closure with 2-0 plain.  Instrument, sponge, and needle counts were correct prior the abdominal closure and at the conclusion of the case.   Findings: FTLM, apgars 8/9  Estimated Blood Loss:  500         Drains: foley                 Specimens: placenta                 Complications:  None; patient tolerated the procedure well.         Disposition: PACU - hemodynamically stable.         Condition: stable  Attending Attestation: I performed the procedure.

## 2015-06-26 NOTE — Progress Notes (Signed)
Patient ID: Penny [name], female   DOB: [birth year], 39 y.o.   MRN: [id number]

## 2015-06-26 NOTE — Anesthesia Preprocedure Evaluation (Addendum)
Anesthesia Evaluation  Patient identified by MRN, date of birth, ID band Patient awake    Reviewed: Allergy & Precautions, H&P , Patient's Chart, lab work & pertinent test results  Airway Mallampati: II  TM Distance: >3 FB Neck ROM: full    Dental no notable dental hx.    Pulmonary    Pulmonary exam normal breath sounds clear to auscultation       Cardiovascular Exercise Tolerance: Good  Rhythm:regular Rate:Normal     Neuro/Psych    GI/Hepatic   Endo/Other    Renal/GU      Musculoskeletal   Abdominal   Peds  Hematology  (+) anemia ,   Anesthesia Other Findings PIH Low K+ Previous CS  Reproductive/Obstetrics                            Anesthesia Physical Anesthesia Plan  ASA: II and emergent  Anesthesia Plan: Spinal   Post-op Pain Management:    Induction:   Airway Management Planned:   Additional Equipment:   Intra-op Plan:   Post-operative Plan:   Informed Consent: I have reviewed the patients History and Physical, chart, labs and discussed the procedure including the risks, benefits and alternatives for the proposed anesthesia with the patient or authorized representative who has indicated his/her understanding and acceptance.   Dental Advisory Given  Plan Discussed with: CRNA  Anesthesia Plan Comments: (Lab work confirmed with CRNA in room. Platelets okay. Discussed spinal anesthetic, and patient consents to the procedure:  included risk of possible headache,backache, failed block, allergic reaction, and nerve injury. This patient was asked if she had any questions or concerns before the procedure started. )        Anesthesia Quick Evaluation

## 2015-06-26 NOTE — Transfer of Care (Signed)
Immediate Anesthesia Transfer of Care Note  Patient: Penny [name]  Procedure(s) Performed: Procedure(s): CESAREAN SECTION (N/A)  Patient Location: PACU  Anesthesia Type:Spinal and Epidural  Level of Consciousness: awake, alert  and oriented  Airway & Oxygen Therapy: Patient Spontanous Breathing  Post-op Assessment: Report given to RN and Post -op Vital signs reviewed and stable  Post vital signs: Reviewed and stable  Last Vitals:  Filed Vitals:   06/26/15 2009  BP: 169/84  Pulse: 63  Temp: 36.9 C  Resp: 20    Complications: No apparent anesthesia complications

## 2015-06-26 NOTE — Anesthesia Postprocedure Evaluation (Signed)
  Anesthesia Post-op Note  Patient: Penny [name]  Procedure(s) Performed: Procedure(s): CESAREAN SECTION (N/A)  Patient is awake, responsive, moving her legs, and has signs of resolution of her numbness. Pain and nausea are reasonably well controlled. Vital signs are stable and clinically acceptable. Oxygen saturation is clinically acceptable. There are no apparent anesthetic complications at this time. Patient is ready for discharge.

## 2015-06-26 NOTE — Progress Notes (Signed)
Patient ID: Penny [name], female   DOB: 18-Feb-1976, 39 y.o.   MRN: [id number] Patient seen and examined. Consent witnessed and signed. No changes noted. Update completed.

## 2015-06-26 NOTE — Patient Instructions (Signed)
Your procedure is scheduled on:  June 29, 2015  Enter through the Main Entrance of [facility] at:  2:00 pm   Pick up the phone at the desk and dial [phone].  Call this number if you have problems the morning of surgery: [phone].  Remember: Do NOT eat food: after midnight on Sunday  Do NOT drink clear liquids after: 11:25 am day of surgery  Take these medicines the morning of surgery with a SIP OF WATER: potassium chloride   Do NOT wear jewelry (body piercing), metal hair clips/bobby pins, or nail polish. Do NOT wear lotions, powders, or perfumes.  You may wear deoderant. Do NOT shave for 48 hours prior to surgery. Do NOT bring valuables to the hospital. Contacts, dentures, or bridgework may not be worn into surgery. Leave suitcase in car.  After surgery it may be brought to your room.  For patients admitted to the hospital, checkout time is 11:00 AM the day of discharge.

## 2015-06-26 NOTE — H&P (Signed)
Penny [name] is a 39 y.o. female presenting for rpt csection for severe PEC . Maternal Medical History:  Contractions: Onset was less than 1 hour ago.   Frequency: regular.   Perceived severity is mild.    Fetal activity: Perceived fetal activity is normal.   Last perceived fetal movement was within the past hour.    Prenatal complications: Pre-eclampsia.   Prenatal Complications - Diabetes: none.    OB History    Gravida Para Term Preterm AB TAB SAB Ectopic Multiple Living   5 1 0 1 3 0 2 1 0 1      Past Medical History  Diagnosis Date  . Polycystic ovaries   . Cervical insufficiency in pregnancy, antepartum ([redacted]w[redacted]d) 05/02/2012  . McDonald cerclage present 05/02/2012  . Postpartum care following cesarean delivery (8/27 - [redacted]w[redacted]d abruption) 06/13/2012   Past Surgical History  Procedure Laterality Date  . Ectopic pregnancy surgery    . Salpingectomy      Right  . Cervical cerclage  05/01/2012    Procedure: CERCLAGE CERVICAL;  Surgeon: Lenoard Aden, MD;  Location: [facility];  Service: Gynecology;  Laterality: N/A;  rescue 18wks  . Cesarean section  06/12/2012    Procedure: CESAREAN SECTION;  Surgeon: Lenoard Aden, MD;  Location: [facility];  Service: Gynecology;  Laterality: N/A;  . Cerclage laparoscopic abdominal N/A 01/08/2015    Procedure: CERCLAGE LAPAROSCOPIC ABDOMINAL, LAPAROSCOPIC LYSIS OF ADHESIONS, INTRAOPERATIVE TRANSVAGINAL ULTRASOUND GUIDANCE;  Surgeon: Fermin Schwab, MD;  Location: [facility];  Service: Gynecology;  Laterality: N/A;   Family History: family history is not on file. Social History:  reports that she has never smoked. She has never used smokeless tobacco. She reports that she does not drink alcohol or use illicit drugs.   Prenatal Transfer Tool  Maternal Diabetes: No Genetic Screening: Normal Maternal Ultrasounds/Referrals: Normal Fetal Ultrasounds or other Referrals:  None Maternal Substance Abuse:  No Significant Maternal Medications:   None Significant Maternal Lab Results:  None Other Comments:  None  Review of Systems  Constitutional: Negative.   Eyes: Positive for photophobia.  Respiratory: Negative.   Cardiovascular: Negative.   Gastrointestinal: Positive for heartburn.  Genitourinary: Negative.   Musculoskeletal: Negative.   Neurological: Positive for headaches.  Psychiatric/Behavioral: Negative.       Blood pressure 175/86, pulse 67, resp. rate 20, last menstrual period 10/01/2014, unknown if currently breastfeeding. Maternal Exam:  Uterine Assessment: Contraction strength is mild.  Contraction frequency is irregular.   Abdomen: Patient reports no abdominal tenderness. Surgical scars: low transverse.   Fetal presentation: vertex  Introitus: Normal vulva. Normal vagina.  Ferning test: negative.  Nitrazine test: negative. Amniotic fluid character: not assessed.  Pelvis: questionable for delivery.   Cervix: Cervix evaluated by digital exam.     Physical Exam  Nursing note and vitals reviewed. Constitutional: She is oriented to person, place, and time. She appears well-developed and well-nourished.  HENT:  Head: Normocephalic.  Eyes: Pupils are equal, round, and reactive to light.  Neck: Normal range of motion. Neck supple.  Cardiovascular: Normal rate and regular rhythm.   Respiratory: Effort normal and breath sounds normal.  GI: Soft. Bowel sounds are normal.  Genitourinary: Vagina normal and uterus normal.  Musculoskeletal: Normal range of motion.  Neurological: She is alert and oriented to person, place, and time. She has normal reflexes.  Skin: Skin is warm.  Psychiatric: She has a normal mood and affect.    Prenatal labs: ABO, Rh: --/--/O POS (09/09 1535) Antibody: NEG (  09/09 1535) Rubella: Immune (02/25 0000) RPR: Nonreactive (02/25 0000)  HBsAg: Negative (02/25 0000)  HIV: Non-reactive (02/25 0000)  GBS:     Assessment/Plan: 36 week IUP Severe PEC Rpt csection, labs  done. Consent done.   Penny [name] 06/26/2015, 8:03 PM

## 2015-06-26 NOTE — Anesthesia Procedure Notes (Signed)
Spinal Patient location during procedure: OR Preanesthetic Checklist Completed: patient identified, site marked, surgical consent, pre-op evaluation, timeout performed, IV checked, risks and benefits discussed and monitors and equipment checked Spinal Block Patient position: sitting Prep: DuraPrep Patient monitoring: cardiac monitor, continuous pulse ox, blood pressure and heart rate Approach: midline Location: L3-4 Injection technique: catheter Needle Needle type: Tuohy and Sprotte  Needle gauge: 24 G Needle length: 12.7 cm Needle insertion depth: 6 cm Catheter type: closed end flexible Catheter size: 19 g Catheter at skin depth: 12 cm Assessment Sensory level: T6 Additional Notes Spinal Dosage in OR  Bupivicaine ml       1.5 PFMS04   mcg        100 Fentanyl mcg            25  (-) asp catheter for CSF/Heme

## 2015-06-27 LAB — COMPREHENSIVE METABOLIC PANEL
ALT: 175 U/L — ABNORMAL HIGH (ref 14–54)
ALT: 140 U/L — ABNORMAL HIGH (ref 14–54)
AST: 154 U/L — AB (ref 15–41)
AST: 98 U/L — ABNORMAL HIGH (ref 15–41)
Albumin: 3.1 g/dL — ABNORMAL LOW (ref 3.5–5.0)
Albumin: 2.8 g/dL — ABNORMAL LOW (ref 3.5–5.0)
Alkaline Phosphatase: 166 U/L — ABNORMAL HIGH (ref 38–126)
Alkaline Phosphatase: 147 U/L — ABNORMAL HIGH (ref 38–126)
Anion gap: 9 (ref 5–15)
Anion gap: 7 (ref 5–15)
BILIRUBIN TOTAL: 1.4 mg/dL — AB (ref 0.3–1.2)
BUN: 6 mg/dL (ref 6–20)
CO2: 26 mmol/L (ref 22–32)
CO2: 29 mmol/L (ref 22–32)
Calcium: 7.8 mg/dL — ABNORMAL LOW (ref 8.9–10.3)
Calcium: 7.9 mg/dL — ABNORMAL LOW (ref 8.9–10.3)
Chloride: 101 mmol/L (ref 101–111)
Chloride: 102 mmol/L (ref 101–111)
Creatinine, Ser: 0.43 mg/dL — ABNORMAL LOW (ref 0.44–1.00)
Creatinine, Ser: 0.55 mg/dL (ref 0.44–1.00)
GFR calc Af Amer: >60 mL/min (ref >60)
GFR calc non Af Amer: >60 mL/min (ref >60)
Glucose, Bld: 98 mg/dL (ref 65–99)
Glucose, Bld: 120 mg/dL — ABNORMAL HIGH (ref 65–99)
POTASSIUM: 2.5 mmol/L — AB (ref 3.5–5.1)
Potassium: 2.5 mmol/L — CL (ref 3.5–5.1)
Sodium: 136 mmol/L (ref 135–145)
Sodium: 138 mmol/L (ref 135–145)
TOTAL PROTEIN: 5.7 g/dL — AB (ref 6.5–8.1)
Total Bilirubin: 1 mg/dL (ref 0.3–1.2)
Total Protein: 5 g/dL — ABNORMAL LOW (ref 6.5–8.1)

## 2015-06-27 LAB — CBC
HCT: 27.6 % — ABNORMAL LOW (ref 36.0–46.0)
HEMATOCRIT: 30.2 % — AB (ref 36.0–46.0)
Hemoglobin: 10.2 g/dL — ABNORMAL LOW (ref 12.0–15.0)
Hemoglobin: 9.3 g/dL — ABNORMAL LOW (ref 12.0–15.0)
MCH: 30.4 pg (ref 26.0–34.0)
MCH: 30.6 pg (ref 26.0–34.0)
MCHC: 33.8 g/dL (ref 30.0–36.0)
MCHC: 33.7 g/dL (ref 30.0–36.0)
MCV: 89.9 fL (ref 78.0–100.0)
MCV: 90.8 fL (ref 78.0–100.0)
Platelets: 186 10*3/uL (ref 150–400)
Platelets: 182 10*3/uL (ref 150–400)
RBC: 3.36 MIL/uL — ABNORMAL LOW (ref 3.87–5.11)
RBC: 3.04 MIL/uL — ABNORMAL LOW (ref 3.87–5.11)
RDW: 14.7 % (ref 11.5–15.5)
RDW: 14.8 % (ref 11.5–15.5)
WBC: 10.6 10*3/uL — AB (ref 4.0–10.5)
WBC: 9.9 10*3/uL (ref 4.0–10.5)

## 2015-06-27 LAB — RPR: RPR: NONREACTIVE

## 2015-06-27 MED ORDER — NALOXONE HCL 1 MG/ML IJ SOLN
1.0000 ug/kg/h | INTRAVENOUS | Status: DC | PRN
  Filled 2015-06-27: qty 2

## 2015-06-27 MED ORDER — DIPHENHYDRAMINE HCL 50 MG/ML IJ SOLN
12.5000 mg | INTRAMUSCULAR | Status: DC | PRN
Start: 2015-06-27 — End: 2015-06-27

## 2015-06-27 MED ORDER — KCL-LACTATED RINGERS 20 MEQ/L IV SOLN
INTRAVENOUS | Status: DC
  Administered 2015-06-27 – 2015-06-28 (×3): via INTRAVENOUS
  Filled 2015-06-27 (×4): qty 1000

## 2015-06-27 MED ORDER — LABETALOL HCL 100 MG PO TABS
100.0000 mg | ORAL_TABLET | Freq: Three times a day (TID) | ORAL | Status: DC
  Administered 2015-06-27 – 2015-06-29 (×6): 100 mg via ORAL
  Filled 2015-06-27 (×6): qty 1

## 2015-06-27 MED ORDER — NALBUPHINE HCL 10 MG/ML IJ SOLN
5.0000 mg | Freq: Once | INTRAMUSCULAR | Status: DC | PRN
  Filled 2015-06-27: qty 0.5

## 2015-06-27 MED ORDER — NALOXONE HCL 0.4 MG/ML IJ SOLN: 0.4000 mg | INTRAMUSCULAR | Status: DC | PRN

## 2015-06-27 MED ORDER — SODIUM CHLORIDE 0.9 % IJ SOLN: 3.0000 mL | INTRAMUSCULAR | Status: DC | PRN

## 2015-06-27 MED ORDER — DIPHENHYDRAMINE HCL 25 MG PO CAPS
25.0000 mg | ORAL_CAPSULE | ORAL | Status: DC | PRN
  Administered 2015-06-27: 25 mg via ORAL
  Filled 2015-06-27: qty 1

## 2015-06-27 MED ORDER — POTASSIUM CHLORIDE CRYS ER 20 MEQ PO TBCR
40.0000 meq | EXTENDED_RELEASE_TABLET | Freq: Two times a day (BID) | ORAL | Status: DC
  Administered 2015-06-27 – 2015-06-28 (×3): 40 meq via ORAL
  Filled 2015-06-27 (×4): qty 2

## 2015-06-27 MED ORDER — SCOPOLAMINE 1 MG/3DAYS TD PT72
1.0000 | MEDICATED_PATCH | Freq: Once | TRANSDERMAL | Status: DC
  Filled 2015-06-27: qty 1

## 2015-06-27 MED ORDER — NALBUPHINE HCL 10 MG/ML IJ SOLN
5.0000 mg | INTRAMUSCULAR | Status: DC | PRN
  Filled 2015-06-27: qty 0.5

## 2015-06-27 MED ORDER — ACETAMINOPHEN 500 MG PO TABS
1000.0000 mg | ORAL_TABLET | Freq: Four times a day (QID) | ORAL | Status: DC
  Administered 2015-06-27: 1000 mg via ORAL
  Filled 2015-06-27: qty 2

## 2015-06-27 MED ORDER — ONDANSETRON HCL 4 MG/2ML IJ SOLN: 4.0000 mg | Freq: Three times a day (TID) | INTRAMUSCULAR | Status: DC | PRN

## 2015-06-27 NOTE — Progress Notes (Addendum)
POSTOPERATIVE DAY # 1 S/P CS -  Severe PEC / previous CS  S:         Reports feeling "ok"             Tolerating po intake / no nausea / no vomiting / no flatus / no BM             Bleeding is light             Pain controlled with long-acting narcotic and motrin             Up ad lib / ambulatory/ voiding QS  Newborn breast feeding  / Circumcision planned   O:  VS: BP 159/82 mmHg  Pulse 58  Temp(Src) 97.6 F (36.4 C) (Oral)  Resp 18  SpO2 98%  LMP 10/01/2014 (Exact Date)  Breastfeeding? Unknown               BP: 146/85 - 159/91 - 155/90 - 159/88 - 148/92 - 156/82                            (previous on Labetalol 100BID)   LABS: creat 0.4  / SGOT 154 / SGPT 175                         Fasting glucose 98                         Potassium 2.5  (hx hypokalemia - chronic x several years)              Recent Labs  06/26/15 1900 06/27/15 0602  WBC 7.4 10.6*  HGB 10.5* 10.2*  PLT 202 186               Bloodtype: --/--/O POS (09/09 1535)  Rubella: Immune (02/25 0000)                tdap 2016  (needs flu)                                          I&O: net POS 390             Weight:  admission - 91.4 Kg (201.8)                            today - 87.2 Kg   (191.84)             Physical Exam:             Alert and Oriented X3  Lungs: Clear and unlabored  Heart: regular rate and rhythm / no mumurs  Abdomen: soft, non-tender, non-distended, hypoactive BS             Fundus: firm, non-tender, Ueven             Dressing intact              Incision:  approximated with suture / no erythema / no ecchymosis / no drainage  Perineum: intact  Lochia: light  Extremities: 2+ pedal edema, no calf pain or tenderness, SCD in place  A:        POD # 1 S/P CS -repeat  Severe preeclampsia with increasing LE  - no diuresis yet            Hypokalemia            ABL anemia - no drop in HGB post-op related to PEC with hemoconcentration  P:       Routine postoperative care      continue magnesium sulfate - check PIH labs Q 12hr with magnesium level            start KCL IV                recheck K Q 12 hour and change to oral maintenance when normal range achieved            DC pitocin             Restart Labetalol 100mg  TID            No diuretic at this time with low potassium             Dr Billy Coast updated             re-assess this PM   Marlinda Mike CNM, MSN, [facility] 06/27/2015, 9:26 AM

## 2015-06-27 NOTE — Lactation Note (Signed)
This note was copied from the chart of Penny [name]. Lactation Consultation Note Initial visit at 18 hours of age.  Mom is in ANTE recovering from delivery and on mag.   Mom reports baby is breastfeeding well and denies concerns.  Last feeding was several hours ago, mom is waiting for her meal tray and then will feed baby again after that.  Baby asleep in crib.  [facility] LC resources given and discussed.  Encouraged to feed with early cues on demand.  Early newborn behavior discussed.  Hand expression demonstrated with colostrum visible.  Mom to call for assist as needed.    Patient Name: Penny Konstantina Nachreiner ZOXWR'U Date: 06/27/2015 Reason for consult: Initial assessment   Maternal Data Has patient been taught Hand Expression?: Yes Does the patient have breastfeeding experience prior to this delivery?: Yes  Feeding    LATCH Score/Interventions                Intervention(s): Breastfeeding basics reviewed     Lactation Tools Discussed/Used     Consult Status Consult Status: Follow-up Date: 06/28/15 Follow-up type: In-patient    Jannifer Rodney 06/27/2015, 4:00 PM

## 2015-06-27 NOTE — Addendum Note (Signed)
Addendum  created 06/27/15 0941 by Algis Greenhouse, CRNA   Modules edited: Notes Section   Notes Section:  File: [id number]

## 2015-06-27 NOTE — Progress Notes (Signed)
Recheck on magnesium sulfate  VS: BP 142/81 mmHg  Pulse 76  Temp(Src) 98 F (36.7 C) (Oral)  Resp 20  Wt 87.227 kg (192 lb 4.8 oz)  SpO2 98%  LMP 10/01/2014 (Exact Date)  Breastfeeding? Unknown   BP: 142-147 / 72-81  I&O - remains net positive with some increase in urine outpt  LABS: Results for Penny [name], Penny [name] (MRN [id number]) as of 06/27/2015 20:26  Ref. Range 06/26/2015 19:00  Sodium Latest Ref Range: 135-145 mmol/L 140  Potassium Latest Ref Range: 3.5-5.1 mmol/L 2.8 (L)  BUN Latest Ref Range: 6-20 mg/dL 6  Creatinine Latest Ref Range: 0.44-1.00 mg/dL 0.27  Glucose Latest Ref Range: 65-99 mg/dL 253 (H)  Anion gap Latest Ref Range: 5-15  8  Alkaline Phosphatase Latest Ref Range: 38-126 U/L 182 (H)  Albumin Latest Ref Range: 3.5-5.0 g/dL 3.6  AST Latest Ref Range: 15-41 U/L 108 (H)  ALT Latest Ref Range: 14-54 U/L 119 (H)  Hemoglobin Latest Ref Range: 12.0-15.0 g/dL 66.4 (L)  HCT Latest Ref Range: 36.0-46.0 % 31.4 (L)  Platelets Latest Ref Range: 150-400 K/uL 202   Improving LE - continue magnesium No significant increase in potassium  - recheck in AM & consider 2 runs of potassium instead of IVF addiditive Restart oral KCL - BID Continue current management          Marlinda Mike CNM, MSN, [facility] 06/27/2015, 8:24 PM

## 2015-06-27 NOTE — Anesthesia Postprocedure Evaluation (Signed)
Anesthesia Post Note  Patient: Penny [name]  Procedure(s) Performed: Procedure(s) (LRB): CESAREAN SECTION (N/A)  Anesthesia type: Spinal  Patient location: Mother/Baby  Post pain: Pain level controlled  Post assessment: Post-op Vital signs reviewed  Last Vitals:  Filed Vitals:   06/27/15 0900  BP: 155/90  Pulse: 58  Temp:   Resp:     Post vital signs: Reviewed  Level of consciousness: awake  Complications: No apparent anesthesia complications

## 2015-06-28 LAB — CBC
HCT: 26.9 % — ABNORMAL LOW (ref 36.0–46.0)
Hemoglobin: 9 g/dL — ABNORMAL LOW (ref 12.0–15.0)
MCH: 30.5 pg (ref 26.0–34.0)
MCHC: 33.5 g/dL (ref 30.0–36.0)
MCV: 91.2 fL (ref 78.0–100.0)
Platelets: 178 10*3/uL (ref 150–400)
RBC: 2.95 MIL/uL — ABNORMAL LOW (ref 3.87–5.11)
RDW: 15.1 % (ref 11.5–15.5)
WBC: 8.5 10*3/uL (ref 4.0–10.5)

## 2015-06-28 LAB — COMPREHENSIVE METABOLIC PANEL
ALT: 109 U/L — ABNORMAL HIGH (ref 14–54)
ALT: 98 U/L — ABNORMAL HIGH (ref 14–54)
AST: 58 U/L — ABNORMAL HIGH (ref 15–41)
AST: 45 U/L — ABNORMAL HIGH (ref 15–41)
Albumin: 2.7 g/dL — ABNORMAL LOW (ref 3.5–5.0)
Albumin: 2.9 g/dL — ABNORMAL LOW (ref 3.5–5.0)
Alkaline Phosphatase: 133 U/L — ABNORMAL HIGH (ref 38–126)
Alkaline Phosphatase: 129 U/L — ABNORMAL HIGH (ref 38–126)
Anion gap: 7 (ref 5–15)
Anion gap: 5 (ref 5–15)
BUN: <5 mg/dL — ABNORMAL LOW (ref 6–20)
BUN: <5 mg/dL — ABNORMAL LOW (ref 6–20)
CO2: 27 mmol/L (ref 22–32)
CO2: 28 mmol/L (ref 22–32)
Calcium: 7.3 mg/dL — ABNORMAL LOW (ref 8.9–10.3)
Calcium: 7.9 mg/dL — ABNORMAL LOW (ref 8.9–10.3)
Chloride: 105 mmol/L (ref 101–111)
Chloride: 104 mmol/L (ref 101–111)
Creatinine, Ser: 0.47 mg/dL (ref 0.44–1.00)
Creatinine, Ser: 0.52 mg/dL (ref 0.44–1.00)
GFR calc Af Amer: >60 mL/min (ref >60)
GFR calc Af Amer: >60 mL/min (ref >60)
GFR calc non Af Amer: >60 mL/min (ref >60)
GFR calc non Af Amer: >60 mL/min (ref >60)
Glucose, Bld: 137 mg/dL — ABNORMAL HIGH (ref 65–99)
Glucose, Bld: 133 mg/dL — ABNORMAL HIGH (ref 65–99)
Potassium: 2.7 mmol/L — CL (ref 3.5–5.1)
Potassium: 3.1 mmol/L — ABNORMAL LOW (ref 3.5–5.1)
Sodium: 139 mmol/L (ref 135–145)
Sodium: 137 mmol/L (ref 135–145)
Total Bilirubin: 0.6 mg/dL (ref 0.3–1.2)
Total Bilirubin: 0.5 mg/dL (ref 0.3–1.2)
Total Protein: 5 g/dL — ABNORMAL LOW (ref 6.5–8.1)
Total Protein: 5.5 g/dL — ABNORMAL LOW (ref 6.5–8.1)

## 2015-06-28 MED ORDER — POTASSIUM CHLORIDE 10 MEQ/100ML IV SOLN
10.0000 meq | INTRAVENOUS | Status: AC
  Administered 2015-06-28 (×2): 10 meq via INTRAVENOUS
  Filled 2015-06-28 (×2): qty 100

## 2015-06-28 NOTE — Progress Notes (Signed)
Critical Lab. Called Potassium resulted 2.7. We and inform CNM.

## 2015-06-28 NOTE — Plan of Care (Signed)
Problem: Phase I Progression Outcomes Goal: Other Phase I Outcomes/Goals Outcome: Completed/Met Date Met:  06/28/15 Magnesium turned off

## 2015-06-28 NOTE — Progress Notes (Signed)
POSTOPERATIVE DAY # 2 S/P CS  S:         Reports feeling well - no PIH symptoms             Tolerating po intake / no nausea / no vomiting / + flatus / no BM             Bleeding is light             Pain controlled with motrin and percocet             Newborn breast feeding  / Circumcision today  O:  VS: BP 155/90 mmHg  Pulse 75  Temp(Src) 98 F (36.7 C) (Oral)  Resp 20  Wt 86.864 kg (191 lb 8 oz)  SpO2 98%  LMP 10/01/2014 (Exact Date)  Breastfeeding? Unknown              BP: 155/90 -133/67-144/70 -126/77 -152/85 - 161/83  LABS:               Recent Labs  06/27/15 1810 06/28/15 0615  WBC 9.9 8.5  HGB 9.3* 9.0*  PLT 182 178                      Results for Penny [name] (MRN [id number]) as of 06/28/2015 10:00  Ref. Range 06/28/2015 06:15  Potassium Latest Ref Range: 3.5-5.1 mmol/L 2.7 (LL)  Chloride Latest Ref Range: 101-111 mmol/L 105  CO2 Latest Ref Range: 22-32 mmol/L 27  BUN Latest Ref Range: 6-20 mg/dL <5 (L)  Creatinine Latest Ref Range: 0.44-1.00 mg/dL 0.47  Calcium Latest Ref Range: 8.9-10.3 mg/dL 7.3 (L)  EGFR (Non-African Amer.) Latest Ref Range: >60 mL/min >60  EGFR (African American) Latest Ref Range: >60 mL/min >60  Glucose Latest Ref Range: 65-99 mg/dL 137 (H)  Anion gap Latest Ref Range: 5-15  7  Alkaline Phosphatase Latest Ref Range: 38-126 U/L 133 (H)  Albumin Latest Ref Range: 3.5-5.0 g/dL 2.7 (L)  AST Latest Ref Range: 15-41 U/L 58 (H)  ALT Latest Ref Range: 14-54 U/L 109 (H)  Total Protein Latest Ref Range: 6.5-8.1 g/dL 5.0 (L)  Total Bilirubin Latest Ref Range: 0.3-1.2 mg/dL 0.6  WBC Latest Ref Range: 4.0-10.5 K/uL 8.5  RBC Latest Ref Range: 3.87-5.11 MIL/uL 2.95 (L)  Hemoglobin Latest Ref Range: 12.0-15.0 g/dL 9.0 (L)  HCT Latest Ref Range: 36.0-46.0 % 26.9 (L)  MCV Latest Ref Range: 78.0-100.0 fL 91.2  MCH Latest Ref Range: 26.0-34.0 pg 30.5  MCHC Latest Ref Range: 30.0-36.0 g/dL 33.5  RDW Latest Ref Range: 11.5-15.5 % 15.1  Platelets  Latest Ref Range: 150-400 K/uL 178        Weight : today 86.86kg (191.09)                 I&O: net negative 1125 ml with good urine outpt             Physical Exam:             Alert and Oriented X3  Lungs: Clear and unlabored  Heart: regular rate and rhythm / no mumurs  Abdomen: soft, non-tender, non-distended, active BS, umbilical hernia - soft             Fundus: firm, non-tender, Ueven             Dressing intact honeycomb              Incision:  approximated with suture / no erythema / no  ecchymosis / no drainage  Perineum: intact  Lochia: light  Extremities: 1+ edema, no calf pain or tenderness, SCD  A:        POD # 2 S/P CS            Hypokalemia- chronic of unknown etiology            Preeclampsia improving - LE decreasing / no epigastric pain /  (+) diuresis  P:        Routine postoperative care              Consult with Dr Penny [name]:                   DC magnesium - continue STRICT I&O                  continue K-dur 89mQ BID                  KCL X 2 runs this am                  recheck CMP this evening and again in AM                  If diuretic needed - consider spironolactone 547mdose as K-sparing              BAArtelia LarocheNM, MSN, [facility]/08/2015, 9:58 AM

## 2015-06-28 NOTE — Lactation Note (Signed)
This note was copied from the chart of Penny [name]. Lactation Consultation Note  Patient Name: Penny Ruhee Enck ZOXWR'U Date: 06/28/2015 Reason for consult: Follow-up assessment  Baby 49 hours old. Mom states baby nursing very well. Mom asked if she should start pumping. Discussed with mom that baby is her best pump and to keep putting baby to breast with cues. Mom states that she nursed her first child for 18 months without any issues.  Maternal Data    Feeding Feeding Type: Breast Fed Length of feed: 20 min  LATCH Score/Interventions                      Lactation Tools Discussed/Used     Consult Status Consult Status: PRN    Geralynn Ochs 06/28/2015, 10:22 PM

## 2015-06-28 NOTE — Progress Notes (Signed)
   06/28/15 0400  Vitals  BP (!) 161/83 mmHg  BP Location Left Arm  BP Method Automatic  Patient Position (if appropriate) Lying  Pulse Rate 87  Pulse Rate Source Monitor  CNM Marlinda Mike was called and informed. Pt will be evaluated in AM

## 2015-06-29 ENCOUNTER — Ambulatory Visit: Payer: Self-pay

## 2015-06-29 ENCOUNTER — Inpatient Hospital Stay (HOSPITAL_COMMUNITY)
Admission: RE | Admit: 2015-06-29 | Payer: BLUE CROSS/BLUE SHIELD | Source: Ambulatory Visit | Admitting: Obstetrics and Gynecology

## 2015-06-29 ENCOUNTER — Encounter (HOSPITAL_COMMUNITY): Admission: RE | Payer: Self-pay | Source: Ambulatory Visit

## 2015-06-29 ENCOUNTER — Encounter (HOSPITAL_COMMUNITY): Payer: Self-pay | Admitting: *Deleted

## 2015-06-29 LAB — COMPREHENSIVE METABOLIC PANEL
ALT: 81 U/L — ABNORMAL HIGH (ref 14–54)
AST: 38 U/L (ref 15–41)
Albumin: 2.9 g/dL — ABNORMAL LOW (ref 3.5–5.0)
Alkaline Phosphatase: 130 U/L — ABNORMAL HIGH (ref 38–126)
Anion gap: 5 (ref 5–15)
BUN: 6 mg/dL (ref 6–20)
CO2: 27 mmol/L (ref 22–32)
Calcium: 8.2 mg/dL — ABNORMAL LOW (ref 8.9–10.3)
Chloride: 105 mmol/L (ref 101–111)
Creatinine, Ser: 0.47 mg/dL (ref 0.44–1.00)
GFR calc Af Amer: >60 mL/min (ref >60)
GFR calc non Af Amer: >60 mL/min (ref >60)
Glucose, Bld: 127 mg/dL — ABNORMAL HIGH (ref 65–99)
Potassium: 3 mmol/L — ABNORMAL LOW (ref 3.5–5.1)
Sodium: 137 mmol/L (ref 135–145)
Total Bilirubin: 0.5 mg/dL (ref 0.3–1.2)
Total Protein: 5.4 g/dL — ABNORMAL LOW (ref 6.5–8.1)

## 2015-06-29 SURGERY — Surgical Case
Anesthesia: Spinal

## 2015-06-29 MED ORDER — IBUPROFEN 600 MG PO TABS: 600.0000 mg | ORAL_TABLET | Freq: Four times a day (QID) | ORAL | Status: AC

## 2015-06-29 MED ORDER — POTASSIUM CHLORIDE CRYS ER 20 MEQ PO TBCR: 40.0000 meq | EXTENDED_RELEASE_TABLET | Freq: Two times a day (BID) | ORAL | Status: AC

## 2015-06-29 MED ORDER — LABETALOL HCL 100 MG PO TABS: 100.0000 mg | ORAL_TABLET | Freq: Three times a day (TID) | ORAL | Status: DC

## 2015-06-29 MED ORDER — OXYCODONE-ACETAMINOPHEN 5-325 MG PO TABS: 1.0000 | ORAL_TABLET | ORAL | Status: DC | PRN

## 2015-06-29 NOTE — Discharge Summary (Signed)
DISCHARGE SUMMARY:  Patient ID: Penny [name] MRN: [id number] DOB/AGE: 04-14-1976 39 y.o.  Admit date: 06/26/2015 Admission Diagnoses: 38.[redacted] weeks gestation, severe PEC   Discharge date: 06/29/2015 Discharge Diagnoses: S/P C/S on 06/26/15; Hypokalemia; Anemia; PEC, delivered        Prenatal history: [id number]   EDC: 07/08/2015, by Last Menstrual Period  Prenatal care at [facility] since [redacted] wks gestation. Primary provider: Dr. Billy Coast Prenatal course complicated by AMA, rescue cerclage  wks then CS  wks w/G5, abdominal cerclage placed  wks with current pregnancy, gestational HTN with progression into PEC, PCOS on Metformin  Prenatal labs: ABO, Rh: --/--/O POS (09/09 1535)  Antibody: NEG (09/09 1535) Rubella:   Immune RPR: Non Reactive (09/09 1535)  HBsAg: Negative (02/25 0000)  HIV: Non-reactive (02/25 0000)  GBS:   Neg GTT: 125  Medical / Surgical History :  Past medical history:  Past Medical History  Diagnosis Date  . Polycystic ovaries   . Cervical insufficiency in pregnancy, antepartum ([redacted]w[redacted]d) 05/02/2012  . McDonald cerclage present 05/02/2012  . Postpartum care following cesarean delivery (8/27 - [redacted]w[redacted]d abruption) 06/13/2012    24 wks delivery  . History of cervical cerclage 12/2014  . History of salpingectomy     Right    Past surgical history:  Past Surgical History  Procedure Laterality Date  . Ectopic pregnancy surgery    . Salpingectomy      Right  . Cervical cerclage  05/01/2012    Procedure: CERCLAGE CERVICAL;  Surgeon: Lenoard Aden, MD;  Location: [facility];  Service: Gynecology;  Laterality: N/A;  rescue 18wks  . Cesarean section  06/12/2012    Procedure: CESAREAN SECTION;  Surgeon: Lenoard Aden, MD;  Location: [facility];  Service: Gynecology;  Laterality: N/A;  . Cerclage laparoscopic abdominal N/A 01/08/2015    Procedure: CERCLAGE LAPAROSCOPIC ABDOMINAL, LAPAROSCOPIC LYSIS OF ADHESIONS, INTRAOPERATIVE TRANSVAGINAL ULTRASOUND  GUIDANCE;  Surgeon: Fermin Schwab, MD;  Location: [facility];  Service: Gynecology;  Laterality: N/A;     Medications on Admission: Prescriptions prior to admission  Medication Sig Dispense Refill Last Dose  . Prenatal Vit-Fe Fumarate-FA (PRENATAL MULTIVITAMIN) TABS Take 1 tablet by mouth daily. 90 tablet 4 06/25/2015 at Unknown time  . [DISCONTINUED] potassium chloride SA (K-DUR,KLOR-CON) 20 MEQ tablet Take 20 mEq by mouth daily.    06/25/2015 at Unknown time  . acetaminophen (TYLENOL) 500 MG tablet Take 1,000 mg by mouth every 6 (six) hours as needed for moderate pain.   Not Taking at Unknown time  . indomethacin (INDOCIN) 25 MG capsule Take 1 capsule (25 mg total) by mouth 3 (three) times daily. (Patient not taking: Reported on 06/15/2015) 10 capsule 0 Completed Course at Unknown time  . ondansetron (ZOFRAN) 4 MG tablet Take 1 tablet (4 mg total) by mouth every 8 (eight) hours as needed for nausea or vomiting. (Patient not taking: Reported on 06/15/2015) 20 tablet 0 Not Taking at Unknown time  . oxyCODONE-acetaminophen (PERCOCET) 7.5-325 MG per tablet Take 1 tablet by mouth every 4 (four) hours as needed for pain. (Patient not taking: Reported on 06/15/2015) 30 tablet 0 Not Taking at Unknown time  . potassium chloride SA (K-DUR,KLOR-CON) 20 MEQ tablet Take 2 tablets (40 mEq total) by mouth 2 (two) times daily. 14 tablet 0   . [DISCONTINUED] labetalol (NORMODYNE) 100 MG tablet Take 1 tablet (100 mg total) by mouth 2 (two) times daily. 28 tablet 0     Allergies: Review of patient's allergies indicates no  known allergies.   Intrapartum Course:  Admitted for repeat CS   Postpartum Course: Complicated by ICU admission for MgSO4 x24 hrs and Hypokalemia. Labetalol 100 mg po q8 and K-Dur 40 meq bid after IV admin. Anemia. D/c home on POD #3.  Physical Exam:   VSS: Blood pressure 137/63, pulse 81, temperature 98.3 F (36.8 C), temperature source Oral, resp. rate 18, weight 86.864 kg (191 lb 8 oz), last  menstrual period 10/01/2014, SpO2 97 %, unknown if currently breastfeeding.  LABS:  Recent Labs  06/27/15 1810 06/28/15 0615  WBC 9.9 8.5  HGB 9.3* 9.0*  PLT 182 178    General: alert and oriented x3 Heart: RRR Lungs: CTA bilaterally GI: soft, non-tender, non-distended, BS x4 Lochia: small Uterus: firm below umbilicus Incision: well approximated; honeycomb dressing-no significant erythema, drainage, or edema Extremities: 2+ BLE edema, Homans neg   Newborn Data Live born female  Birth Weight: 7 lb 3.3 oz (3270 g) APGAR: 8, 9  See operative report for further details  Inpatient for phototherapy  Discharge Instructions:  Wound Care: keep clean and dry / remove honeycomb POD 6 Postpartum Instructions: [facility] discharge booklet - instructions reviewed Medications:    Medication List    STOP taking these medications        acetaminophen 500 MG tablet  Commonly known as:  TYLENOL     indomethacin 25 MG capsule  Commonly known as:  INDOCIN     ondansetron 4 MG tablet  Commonly known as:  ZOFRAN     oxyCODONE-acetaminophen 7.5-325 MG per tablet  Commonly known as:  PERCOCET  Replaced by:  oxyCODONE-acetaminophen 5-325 MG per tablet      TAKE these medications        ibuprofen 600 MG tablet  Commonly known as:  ADVIL,MOTRIN  Take 1 tablet (600 mg total) by mouth every 6 (six) hours.     labetalol 100 MG tablet  Commonly known as:  NORMODYNE  Take 1 tablet (100 mg total) by mouth every 8 (eight) hours.     oxyCODONE-acetaminophen 5-325 MG per tablet  Commonly known as:  PERCOCET/ROXICET  Take 1-2 tablets by mouth every 4 (four) hours as needed (for pain scale greater than 7).     potassium chloride SA 20 MEQ tablet  Commonly known as:  K-DUR,KLOR-CON  Take 2 tablets (40 mEq total) by mouth 2 (two) times daily.     prenatal multivitamin Tabs tablet  Take 1 tablet by mouth daily.            Follow-up Information    Follow up with Lenoard Aden,  MD. Schedule an appointment as soon as possible for a visit in 1 week.   Specialty:  Obstetrics and Gynecology   Why:  blood pressure check and labs   Contact information:   [address] [place] [address] [phone]       Follow up with Lenoard Aden, MD. Schedule an appointment as soon as possible for a visit in 6 weeks.   Specialty:  Obstetrics and Gynecology   Contact information:   [address] [place] [address] [phone]         Signed: Donette Larry, Dorris Carnes MSN, CNM 06/29/2015, 10:48 AM

## 2015-06-29 NOTE — Lactation Note (Signed)
This note was copied from the chart of Penny Trinady Greenberger. Lactation Consultation Note  Patient Name: Penny [name] [id number] Date: 06/29/2015 Reason for consult: Follow-up assessment ; Double Phototherapy   Follow-up consult at 66 hours old; 6% weight loss.  Mom is AMA, P2 experienced with first child for 18 months reports no issues with milk production but has Hx of PCOS.  Infant is on double photo-therapy.   Infant has breastfed x12 (15-35 min) per chart; voids-2 in 24 hours/ 9 life; stools-3 in 24 hours/ 9 life. Upon entering room mom was sitting on couch eating and infant was in crib crying with cueing movements for feeding. RN reported to [facility] mom has not been keeping log and would not tell RN when she fed. LC reviewed with mom importance of keeping log with feedings, voids, and stools.  Explained to mom not keeping the log with a baby on double phototherapy could cause a delay in discharge.   Mom voiced complaints that she is tired, the baby is tired, and she "needs time to get her stuff together." Mom then got up to feed baby.  LC pointed out that the baby is showing good cues to feed.   Mom independently latched infant with semi-depth; few swallows heard; LS-9.  Encouraged mom to bring baby's body more around mom so that infant is looking up to feed for a deeper latch and better milk transfer. Mom explained she would try but everything is difficult with double phototherapy equipment on.  Encouragement given to mom and instructed mom to leave baby on first side for at least 15-20+ minutes until baby is finished and then offer second side with each feeding.  Explained importance of emptying first breast and infant getting hindmilk.   Encouraged mom to call for assistance as needed.     Maternal Data    Feeding Feeding Type: Breast Fed Length of feed: 30 min  LATCH Score/Interventions Latch: Grasps breast easily, tongue down, lips flanged, rhythmical sucking.  Audible Swallowing: A few  with stimulation  Type of Nipple: Everted at rest and after stimulation  Comfort (Breast/Nipple): Soft / non-tender     Hold (Positioning): No assistance needed to correctly position infant at breast.  LATCH Score: 9  Lactation Tools Discussed/Used     Consult Status Consult Status: PRN Follow-up type: In-patient    Lendon Ka 06/29/2015, 2:59 PM

## 2015-06-29 NOTE — Progress Notes (Signed)
POD # 3  Subjective: Pt reports feeling well/ Pain controlled with Motrin and Percocet Tolerating po/Voiding without problems/ No n/v/ Flatus present, +BM No HA, visual disturbance, or epigastric pain Activity: ad lib Bleeding is light Newborn info:  Information for the patient's newborn:  Lynlee, Stratton [[id number]]  female  / Circumcision: done/ Feeding: breast   Objective: VS: VS:  Filed Vitals:   06/28/15 1620 06/28/15 2019 06/29/15 0014 06/29/15 0422  BP: 152/94 152/75 141/66 137/63  Pulse: 73 93 80 81  Temp: 97.9 F (36.6 C)   98.3 F (36.8 C)  TempSrc: Oral   Oral  Resp: Weight:      SpO2:        I&O: Intake/Output      09/11 0701 - 09/12 0700 09/12 0701 - 09/13 0700   P.O. 240    I.V. (mL/kg) 525 (6)    IV Piggyback 200    Total Intake(mL/kg) 965 (11.1)    Urine (mL/kg/hr) 1800 (0.9)    Stool 0 (0)    Total Output 1800     Net -835          Stool Occurrence 1 x      LABS:  Recent Labs  06/27/15 1810 06/28/15 0615  WBC 9.9 8.5  HGB 9.3* 9.0*  PLT 182 178   Blood type: --/--/O POS (09/09 1535) Rubella: Immune (02/25 0000)           Physical Exam:  General: alert, cooperative and no distress CV: Regular rate and rhythm Resp: CTA bilaterally Abdomen: soft, nontender, normal bowel sounds Incision: Covered with Tegaderm and honeycomb dressing; no significant drainage, edema, bruising, or erythema; well approximated with suture and steri strips. Uterine Fundus: firm, below umbilicus, nontender Lochia: minimal Ext: edema 2+ BLE and Homans sign is negative, no sign of DVT   Assessment: POD # 3/ [id number]/ S/P C/Section d/t repeat, PEC  PEC, delivered, stable Hypokalemia, improving ABL anemia Doing well and stable for discharge home  Plan: Discharge home RX's: Ibuprofen  po Q 6 hrs prn pain #30 Refill x 1 Percocet 5/325 1 - 2 tabs po every 4 hrs prn pain #30 Refill x 0 Niferex  po QD #30 Refill x 1 K-Dur 40 meq bid, #60,  refill x1 Labetalol  po q8 hrs, #90, refill x1 Follow-up in 1 week at [facility] for BP check and labs Follow up in 6 wks for postpartum check at [facility] booklet given    Signed: Donette Larry, Dorris Carnes, MSN, CNM 06/29/2015, 10:40 AM

## 2015-06-30 ENCOUNTER — Ambulatory Visit: Payer: Self-pay

## 2015-06-30 LAB — TYPE AND SCREEN
ABO/RH(D): O POS
Antibody Screen: NEGATIVE
UNIT DIVISION: 0
UNIT DIVISION: 0

## 2015-06-30 NOTE — Lactation Note (Signed)
This note was copied from the chart of Boy Tresea Housey. Lactation Consultation Note  Baby going home on double phototherapy. Discussed the importance of establishing her milk supply by breastfeeding before giving formula. Encouraged her to post pump and give baby back volume pumped. Provided volume guidelines.   Faxed [facility] pump form.  Patient Name: Penny [name] [id number] Date: 06/30/2015 Reason for consult: Follow-up assessment   Maternal Data    Feeding Feeding Type: Breast Fed  LATCH Score/Interventions Latch: Grasps breast easily, tongue down, lips flanged, rhythmical sucking.  Audible Swallowing: A few with stimulation Intervention(s): Skin to skin  Type of Nipple: Everted at rest and after stimulation  Comfort (Breast/Nipple): Soft / non-tender     Hold (Positioning): No assistance needed to correctly position infant at breast.  LATCH Score: 9  Lactation Tools Discussed/Used     Consult Status Consult Status: Complete    Hardie Pulley 06/30/2015, 12:04 PM

## 2015-07-02 ENCOUNTER — Inpatient Hospital Stay (HOSPITAL_COMMUNITY)
Admission: AD | Admit: 2015-07-02 | Discharge: 2015-07-02 | Disposition: A | Discharge status: Home / Self Care | Payer: BLUE CROSS/BLUE SHIELD | Source: Ambulatory Visit | Attending: Obstetrics and Gynecology | Admitting: Obstetrics and Gynecology

## 2015-07-02 ENCOUNTER — Encounter (HOSPITAL_COMMUNITY): Payer: Self-pay | Admitting: Obstetrics and Gynecology

## 2015-07-02 DIAGNOSIS — O9089 Other complications of the puerperium, not elsewhere classified: Secondary | ICD-10-CM | POA: Insufficient documentation

## 2015-07-02 DIAGNOSIS — R030 Elevated blood-pressure reading, without diagnosis of hypertension: Secondary | ICD-10-CM | POA: Diagnosis present

## 2015-07-02 DIAGNOSIS — O165 Unspecified maternal hypertension, complicating the puerperium: Secondary | ICD-10-CM | POA: Diagnosis present

## 2015-07-02 DIAGNOSIS — I158 Other secondary hypertension: Secondary | ICD-10-CM | POA: Insufficient documentation

## 2015-07-02 HISTORY — DX: Unspecified maternal hypertension, complicating the puerperium: O16.5

## 2015-07-02 LAB — CBC
HCT: 24.5 % — ABNORMAL LOW (ref 36.0–46.0)
Hemoglobin: 8 g/dL — ABNORMAL LOW (ref 12.0–15.0)
MCH: 30.8 pg (ref 26.0–34.0)
MCHC: 32.7 g/dL (ref 30.0–36.0)
MCV: 94.2 fL (ref 78.0–100.0)
Platelets: 251 10*3/uL (ref 150–400)
RBC: 2.6 MIL/uL — ABNORMAL LOW (ref 3.87–5.11)
RDW: 15.7 % — ABNORMAL HIGH (ref 11.5–15.5)
WBC: 10 10*3/uL (ref 4.0–10.5)

## 2015-07-02 LAB — URINALYSIS, ROUTINE W REFLEX MICROSCOPIC
Bilirubin Urine: NEGATIVE
Glucose, UA: NEGATIVE mg/dL
Ketones, ur: NEGATIVE mg/dL
Nitrite: NEGATIVE
PROTEIN: 30 mg/dL — AB
Specific Gravity, Urine: 1.01 (ref 1.005–1.030)
UROBILINOGEN UA: 0.2 mg/dL (ref 0.0–1.0)
pH: 8 (ref 5.0–8.0)

## 2015-07-02 LAB — COMPREHENSIVE METABOLIC PANEL
ALT: 70 U/L — ABNORMAL HIGH (ref 14–54)
AST: 38 U/L (ref 15–41)
Albumin: 3.4 g/dL — ABNORMAL LOW (ref 3.5–5.0)
Alkaline Phosphatase: 132 U/L — ABNORMAL HIGH (ref 38–126)
Anion gap: 7 (ref 5–15)
BUN: 6 mg/dL (ref 6–20)
CO2: 28 mmol/L (ref 22–32)
Calcium: 8.6 mg/dL — ABNORMAL LOW (ref 8.9–10.3)
Chloride: 104 mmol/L (ref 101–111)
Creatinine, Ser: 0.48 mg/dL (ref 0.44–1.00)
GFR calc Af Amer: >60 mL/min (ref >60)
GFR calc non Af Amer: >60 mL/min (ref >60)
Glucose, Bld: 89 mg/dL (ref 65–99)
Potassium: 3.7 mmol/L (ref 3.5–5.1)
Sodium: 139 mmol/L (ref 135–145)
Total Bilirubin: 1.6 mg/dL — ABNORMAL HIGH (ref 0.3–1.2)
Total Protein: 6.5 g/dL (ref 6.5–8.1)

## 2015-07-02 LAB — PROTEIN / CREATININE RATIO, URINE
Creatinine, Urine: 31 mg/dL
Protein Creatinine Ratio: 1.16 mg/mg{Cre} — ABNORMAL HIGH (ref 0.00–0.15)
Total Protein, Urine: 36 mg/dL

## 2015-07-02 LAB — URIC ACID: Uric Acid, Serum: 5.4 mg/dL (ref 2.3–6.6)

## 2015-07-02 LAB — URINE MICROSCOPIC-ADD ON

## 2015-07-02 MED ORDER — LABETALOL HCL 100 MG PO TABS: 200.0000 mg | ORAL_TABLET | Freq: Three times a day (TID) | ORAL | Status: AC

## 2015-07-02 MED ORDER — LABETALOL HCL 100 MG PO TABS
200.0000 mg | ORAL_TABLET | Freq: Once | ORAL | Status: AC
  Administered 2015-07-02: 200 mg via ORAL
  Filled 2015-07-02: qty 2

## 2015-07-02 MED ORDER — LACTATED RINGERS IV SOLN
INTRAVENOUS | Status: DC
  Administered 2015-07-02: 14:00:00 via INTRAVENOUS

## 2015-07-02 MED ORDER — LABETALOL HCL 5 MG/ML IV SOLN
20.0000 mg | Freq: Once | INTRAVENOUS | Status: AC
  Administered 2015-07-02: 20 mg via INTRAVENOUS
  Filled 2015-07-02: qty 4

## 2015-07-02 NOTE — MAU Note (Signed)
Feeding pumped Breast Milk to infant, waiting for results of urine and will call provider for plan of care and diet order

## 2015-07-02 NOTE — MAU Note (Signed)
Pt wants to nurse before getting into bed, warm blanket given for infant, unable to void at this time

## 2015-07-02 NOTE — MAU Provider Note (Signed)
History     CSN: [id number]  Arrival date and time: 07/02/15 1242  Provider on unit: 1320 Provider at bedside: 1330     Chief Complaint  Patient presents with  . Hypertension   HPI  [name] is a 39 yo [phone] female who is 6 days postpartum, sent from the office for elevated blood pressure - systolic 170s.  She is s/p from cesarean delivery with h/o severe PEC.  She has c/o headache and "very swollen feet and ankles".  Denies visual changes, RUQ pain, or N/V. Her primary OB provider at [facility] is Dr. Billy Coast.  Past Medical History  Diagnosis Date  . Polycystic ovaries   . Cervical insufficiency in pregnancy, antepartum ([redacted]w[redacted]d) 05/02/2012  . McDonald cerclage present 05/02/2012  . Postpartum care following cesarean delivery (8/27 - [redacted]w[redacted]d abruption) 06/13/2012    24 wks delivery  . History of cervical cerclage 12/2014  . History of salpingectomy     Right  . Postpartum hypertension 07/02/2015    Past Surgical History  Procedure Laterality Date  . Ectopic pregnancy surgery    . Salpingectomy      Right  . Cervical cerclage  05/01/2012    Procedure: CERCLAGE CERVICAL;  Surgeon: Lenoard Aden, MD;  Location: [facility];  Service: Gynecology;  Laterality: N/A;  rescue 18wks  . Cesarean section  06/12/2012    Procedure: CESAREAN SECTION;  Surgeon: Lenoard Aden, MD;  Location: [facility];  Service: Gynecology;  Laterality: N/A;  . Cerclage laparoscopic abdominal N/A 01/08/2015    Procedure: CERCLAGE LAPAROSCOPIC ABDOMINAL, LAPAROSCOPIC LYSIS OF ADHESIONS, INTRAOPERATIVE TRANSVAGINAL ULTRASOUND GUIDANCE;  Surgeon: Fermin Schwab, MD;  Location: [facility];  Service: Gynecology;  Laterality: N/A;  . Cesarean section N/A 06/26/2015    Procedure: CESAREAN SECTION;  Surgeon: Olivia Mackie, MD;  Location: [facility];  Service: Obstetrics;  Laterality: N/A;    History reviewed. No pertinent family history.  Social History  Substance Use Topics  . Smoking status: Never Smoker   . Smokeless  tobacco: Never Used  . Alcohol Use: No    Allergies: No Known Allergies  Prescriptions prior to admission  Medication Sig Dispense Refill Last Dose  . ibuprofen (ADVIL,MOTRIN) 600 MG tablet Take 1 tablet (600 mg total) by mouth every 6 (six) hours. (Patient taking differently: Take 600 mg by mouth every 6 (six) hours as needed for moderate pain. ) 30 tablet 0 Past Week at Unknown time  . potassium chloride SA (K-DUR,KLOR-CON) 20 MEQ tablet Take 2 tablets (40 mEq total) by mouth 2 (two) times daily. 120 tablet 1 07/01/2015 at Unknown time  . Prenatal Vit-Fe Fumarate-FA (PRENATAL MULTIVITAMIN) TABS Take 1 tablet by mouth daily. 90 tablet 4 07/01/2015 at Unknown time    Review of Systems  Constitutional: Negative.   Eyes: Negative.   Respiratory: Negative.   Cardiovascular: Negative.   Gastrointestinal: Negative.   Genitourinary: Negative.        Normal lochia  Musculoskeletal: Negative.   Skin: Negative.   Neurological: Positive for headaches.  Endo/Heme/Allergies: Negative.   Psychiatric/Behavioral: Negative.    Results for orders placed or performed during the hospital encounter of 07/02/15 (from the past 24 hour(s))  CBC     Status: Abnormal   Collection Time: 07/02/15  1:29 PM  Result Value Ref Range   WBC 10.0 4.0 - 10.5 K/uL   RBC 2.60 (L) 3.87 - 5.11 MIL/uL   Hemoglobin 8.0 (L) 12.0 - 15.0 g/dL   HCT 13.0 (L) 86.5 -  46.0 %   MCV 94.2 78.0 - 100.0 fL   MCH 30.8 26.0 - 34.0 pg   MCHC 32.7 30.0 - 36.0 g/dL   RDW 16.1 (H) 09.6 - 04.5 %   Platelets 251 150 - 400 K/uL  Comprehensive metabolic panel     Status: Abnormal   Collection Time: 07/02/15  1:29 PM  Result Value Ref Range   Sodium 139 135 - 145 mmol/L   Potassium 3.7 3.5 - 5.1 mmol/L   Chloride 104 101 - 111 mmol/L   CO2 28 22 - 32 mmol/L   Glucose, Bld 89 65 - 99 mg/dL   BUN 6 6 - 20 mg/dL   Creatinine, Ser 4.09 0.44 - 1.00 mg/dL   Calcium 8.6 (L) 8.9 - 10.3 mg/dL   Total Protein 6.5 6.5 - 8.1 g/dL   Albumin  3.4 (L) 3.5 - 5.0 g/dL   AST 38 15 - 41 U/L   ALT 70 (H) 14 - 54 U/L   Alkaline Phosphatase 132 (H) 38 - 126 U/L   Total Bilirubin 1.6 (H) 0.3 - 1.2 mg/dL   GFR calc non Af Amer >60 >60 mL/min   GFR calc Af Amer >60 >60 mL/min   Anion gap 7 5 - 15  Uric acid     Status: None   Collection Time: 07/02/15  1:29 PM  Result Value Ref Range   Uric Acid, Serum 5.4 2.3 - 6.6 mg/dL  Urinalysis, Routine w reflex microscopic (not at [facility])     Status: Abnormal   Collection Time: 07/02/15  2:20 PM  Result Value Ref Range   Color, Urine YELLOW YELLOW   APPearance CLEAR CLEAR   Specific Gravity, Urine 1.010 1.005 - 1.030   pH 8.0 5.0 - 8.0   Glucose, UA NEGATIVE NEGATIVE mg/dL   Hgb urine dipstick LARGE (A) NEGATIVE   Bilirubin Urine NEGATIVE NEGATIVE   Ketones, ur NEGATIVE NEGATIVE mg/dL   Protein, ur 30 (A) NEGATIVE mg/dL   Urobilinogen, UA 0.2 0.0 - 1.0 mg/dL   Nitrite NEGATIVE NEGATIVE   Leukocytes, UA SMALL (A) NEGATIVE  Protein / creatinine ratio, urine     Status: Abnormal   Collection Time: 07/02/15  2:20 PM  Result Value Ref Range   Creatinine, Urine 31.00 mg/dL   Total Protein, Urine 36 mg/dL   Protein Creatinine Ratio 1.16 (H) 0.00 - 0.15 mg/mg[Cre]  Urine microscopic-add on     Status: Abnormal   Collection Time: 07/02/15  2:20 PM  Result Value Ref Range   Squamous Epithelial / LPF RARE RARE   WBC, UA 3-6 <3 WBC/hpf   RBC / HPF 21-50 <3 RBC/hpf   Bacteria, UA MANY (A) RARE   Physical Exam   Serial blood pressure range: 159/91 to 175/96, pulse 78, resp. rate 18, last menstrual period 10/01/2014, currently breastfeeding.  Physical Exam  Constitutional: She is oriented to person, place, and time. She appears well-developed and well-nourished.  HENT:  Head: Normocephalic and atraumatic.  Eyes: Conjunctivae are normal. Pupils are equal, round, and reactive to light.  Neck: Normal range of motion.  Cardiovascular: Normal rate, regular rhythm, normal heart sounds and intact  distal pulses.   Respiratory: Effort normal and breath sounds normal.  GI: Soft. Bowel sounds are normal.  Genitourinary:  Pelvic deferred  Musculoskeletal: Normal range of motion. She exhibits edema.  +3 pitting edema ankles and feet bilaterally  Neurological: She is alert and oriented to person, place, and time. She has normal reflexes.  Skin:  Skin is warm and dry.  Psychiatric: She has a normal mood and affect. Her behavior is normal. Judgment and thought content normal.    MAU Course  Procedures PIH labs Protein/creatinine ratio LR @ 125 ml/hr Labetalol 20 mg IVP Labetalol 200 mg PO Regular Diet Assessment and Plan  6 days Postpartum PEC; postpartum - improving labs  Discharge home  Start Labetalol 200 mg TID Blood pressure check in the office with Dr. Billy Coast on Monday afternoon PEC precautions reviewed Call the office for any PEC s/s  *Dr. Billy Coast notified of assessment and plan - agrees / consult on POC - recommends discharge home, continue Labetalol and F/U Monday  Raelyn Mora, M MSN, CNM 07/02/2015, 4:40 PM

## 2015-07-02 NOTE — MAU Note (Signed)
Spoke with Dr. Billy Coast, reported labs and additional dose of med, plan is to take a few more BPs and Penny [name] will see for discharge

## 2015-07-02 NOTE — MAU Note (Signed)
Pt sent from office for PP elevated b/p 170''s c/o headach in the back of her head bilateral pedal edema 2+

## 2015-07-02 NOTE — MAU Note (Signed)
Pt just giving urine for labs ordered, pt is now pumping since infant asleep

## 2015-07-02 NOTE — Discharge Instructions (Signed)
Preeclampsia and Eclampsia °Preeclampsia is a serious condition that develops only during pregnancy. It is also called toxemia of pregnancy. This condition causes high blood pressure along with other symptoms, such as swelling and headaches. These may develop as the condition gets worse. Preeclampsia may occur 20 weeks or later into your pregnancy.  °Diagnosing and treating preeclampsia early is very important. If not treated early, it can cause serious problems for you and your baby. One problem it can lead to is eclampsia, which is a condition that causes muscle jerking or shaking (convulsions) in the mother. Delivering your baby is the best treatment for preeclampsia or eclampsia.  °RISK FACTORS °The cause of preeclampsia is not known. You may be more likely to develop preeclampsia if you have certain risk factors. These include:  °· Being pregnant for the first time. °· Having preeclampsia in a past pregnancy. °· Having a family history of preeclampsia. °· Having high blood pressure. °· Being pregnant with twins or triplets. °· Being 35 or older. °· Being African American. °· Having kidney disease or diabetes. °· Having medical conditions such as lupus or blood diseases. °· Being very overweight (obese). °SIGNS AND SYMPTOMS  °The earliest signs of preeclampsia are: °· High blood pressure. °· Increased protein in your urine. Your health care provider will check for this at every prenatal visit. °Other symptoms that can develop include:  °· Severe headaches. °· Sudden weight gain. °· Swelling of your hands, face, legs, and feet. °· Feeling sick to your stomach (nauseous) and throwing up (vomiting). °· Vision problems (blurred or double vision). °· Numbness in your face, arms, legs, and feet. °· Dizziness. °· Slurred speech. °· Sensitivity to bright lights. °· Abdominal pain. °DIAGNOSIS  °There are no screening tests for preeclampsia. Your health care provider will ask you about symptoms and check for signs of  preeclampsia during your prenatal visits. You may also have tests, including: °· Urine testing. °· Blood testing. °· Checking your baby's heart rate. °· Checking the health of your baby and your placenta using images created with sound waves (ultrasound). °TREATMENT  °You can work out the best treatment approach together with your health care provider. It is very important to keep all prenatal appointments. If you have an increased risk of preeclampsia, you may need more frequent prenatal exams. °· Your health care provider may prescribe bed rest. °· You may have to eat as little salt as possible. °· You may need to take medicine to lower your blood pressure if the condition does not respond to more conservative measures. °· You may need to stay in the hospital if your condition is severe. There, treatment will focus on controlling your blood pressure and fluid retention. You may also need to take medicine to prevent seizures. °· If the condition gets worse, your baby may need to be delivered early to protect you and the baby. You may have your labor started with medicine (be induced), or you may have a cesarean delivery. °· Preeclampsia usually goes away after the baby is born. °HOME CARE INSTRUCTIONS  °· Only take over-the-counter or prescription medicines as directed by your health care provider. °· Lie on your left side while resting. This keeps pressure off your baby. °· Elevate your feet while resting. °· Get regular exercise. Ask your health care provider what type of exercise is safe for you. °· Avoid caffeine and alcohol. °· Do not smoke. °· Drink 6-8 glasses of water every day. °· Eat a balanced diet   that is low in salt. Do not add salt to your food.  Avoid stressful situations as much as possible.  Get plenty of rest and sleep.  Keep all prenatal appointments and tests as scheduled. SEEK MEDICAL CARE IF:  You are gaining more weight than expected.  You have any headaches, abdominal pain, or  nausea.  You are bruising more than usual.  You feel dizzy or light-headed. SEEK IMMEDIATE MEDICAL CARE IF:   You develop sudden or severe swelling anywhere in your body. This usually happens in the legs.  You gain 5 lb (2.3 kg) or more in a week.  You have a severe headache, dizziness, problems with your vision, or confusion.  You have severe abdominal pain.  You have lasting nausea or vomiting.  You have a seizure.  You have trouble moving any part of your body.  You develop numbness in your body.  You have trouble speaking.  You have any abnormal bleeding.  You develop a stiff neck.  You pass out. MAKE SURE YOU:   Understand these instructions.  Will watch your condition.  Will get help right away if you are not doing well or get worse. Document Released: 09/30/2000 Document Revised: 10/08/2013 Document Reviewed: 07/26/2013 [facility] Patient Information 2015 [place], [place]. This information is not intended to replace advice given to you by your health care provider. Make sure you discuss any questions you have with your health care provider. Labetalol tablets What is this medicine? LABETALOL (la BET a lole) is a beta-blocker. Beta-blockers reduce the workload on the heart and help it to beat more regularly. This medicine is used to treat high blood pressure. This medicine may be used for other purposes; ask your health care provider or pharmacist if you have questions. COMMON BRAND NAME(S): Normodyne, Trandate What should I tell my health care provider before I take this medicine? They need to know if you have any of these conditions: -diabetes -history of heart attack, heart disease or heart failure -kidney disease -liver disease -lung or breathing disease, like asthma or emphysema -pheochromocytoma -thyroid disease -an unusual or allergic reaction to labetalol, other beta-blockers, medicines, foods, dyes, or preservatives -pregnant or trying to get  pregnant -breast-feeding How should I use this medicine? Take this medicine by mouth with a glass of water. Follow the directions on the prescription label. Take your doses at regular intervals. Do not take your medicine more often than directed. Do not stop taking this medicine suddenly. This could lead to serious heart-related effects. Talk to your pediatrician regarding the use of this medicine in children. Special care may be needed. Overdosage: If you think you have taken too much of this medicine contact a poison control center or emergency room at once. NOTE: This medicine is only for you. Do not share this medicine with others. What if I miss a dose? If you miss a dose, take it as soon as you can. If it is almost time for your next dose, take only that dose. Do not take double or extra doses. What may interact with this medicine? This medicine also interact with the following medications: -certain medicines for blood pressure, heart disease, irregular heart beat -cimetidine -general anesthetics -medicines for asthma or lung disease like albuterol -medicines for depression -nitroglycerin This list may not describe all possible interactions. Give your health care provider a list of all the medicines, herbs, non-prescription drugs, or dietary supplements you use. Also tell them if you smoke, drink alcohol, or use illegal drugs.  Some items may interact with your medicine. What should I watch for while using this medicine? Visit your doctor or health care professional for regular check ups. Check your blood pressure and pulse rate regularly. Ask your health care professional what your blood pressure and pulse rate should be, and when you should contact him or her. You may get drowsy or dizzy. Do not drive, use machinery, or do anything that needs mental alertness until you know how this medicine affects you. Do not stand or sit up quickly. Alcohol may interfere with the effect of this  medicine. Avoid alcoholic drinks. This medicine can affect blood sugar levels. If you have diabetes, check with your doctor or health care professional before you change your diet or the dose of your diabetic medicine. Do not treat yourself for coughs, colds, or pain while you are taking this medicine without asking your doctor or health care professional for advice. Some ingredients may increase your blood pressure. What side effects may I notice from receiving this medicine? Side effects that you should report to your doctor or health care professional as soon as possible: -allergic reactions like skin rash, itching or hives, swelling of the face, lips, or tongue -breathing problems -cold hands or feet -dark urine -depression -general ill feeling or flu-like symptoms -irregular heartbeat -light-colored stools -loss of appetite, nausea -pain or trouble passing urine -right upper belly pain -slow heart rate (fewer than recommended by your doctor or health care professional) -swollen legs or ankles -tingling of the scalp or skin -unusually weak or tired -vomiting -yellowing of the eyes or skin Side effects that usually do not require medical attention (report to your doctor or health care professional if they continue or are bothersome): -decreased sexual function or desire -dry itching skin -headache -tiredness This list may not describe all possible side effects. Call your doctor for medical advice about side effects. You may report side effects to FDA at 1-800-FDA-1088. Where should I keep my medicine? Keep out of the reach of children. Store at room temperature between 15 and 30 degrees C (59 and 86 degrees F). Protect from light. Keep container tightly closed. Throw away any unused medicine after the expiration date. NOTE: This sheet is a summary. It may not cover all possible information. If you have questions about this medicine, talk to your doctor, pharmacist, or health care  provider.  2015, [employer]. (2013-06-07 14:34:23)

## 2015-07-02 NOTE — MAU Note (Signed)
Meal given

## 2015-07-02 NOTE — MAU Note (Signed)
Ordered meal for patient and her husband.

## 2016-04-25 ENCOUNTER — Encounter (INDEPENDENT_AMBULATORY_CARE_PROVIDER_SITE_OTHER): Payer: Self-pay

## 2016-10-14 ENCOUNTER — Other Ambulatory Visit: Payer: Self-pay | Admitting: Otolaryngology

## 2016-10-14 DIAGNOSIS — R221 Localized swelling, mass and lump, neck: Secondary | ICD-10-CM

## 2016-10-21 ENCOUNTER — Ambulatory Visit
Admission: RE | Admit: 2016-10-21 | Discharge: 2016-10-21 | Disposition: A | Discharge status: Home / Self Care | Payer: BLUE CROSS/BLUE SHIELD | Source: Ambulatory Visit | Attending: Otolaryngology | Admitting: Otolaryngology

## 2016-10-21 DIAGNOSIS — R221 Localized swelling, mass and lump, neck: Secondary | ICD-10-CM

## 2018-10-01 IMAGING — US US SOFT TISSUE HEAD/NECK
1 series · 14 of 15 positions shown · non-contrast
Comparison: None.

CLINICAL DATA: 40-year-old female with a history of submandibular
enlarged lymph nodes

EXAM:
ULTRASOUND OF HEAD/NECK SOFT TISSUES
TECHNIQUE: Ultrasound examination of the head and neck soft tissues was
performed in the area of clinical concern.

[Series 1: us soft tissue head/neck · 0.08mm/px · 14 of 15 slices shown]
[im 1/15]
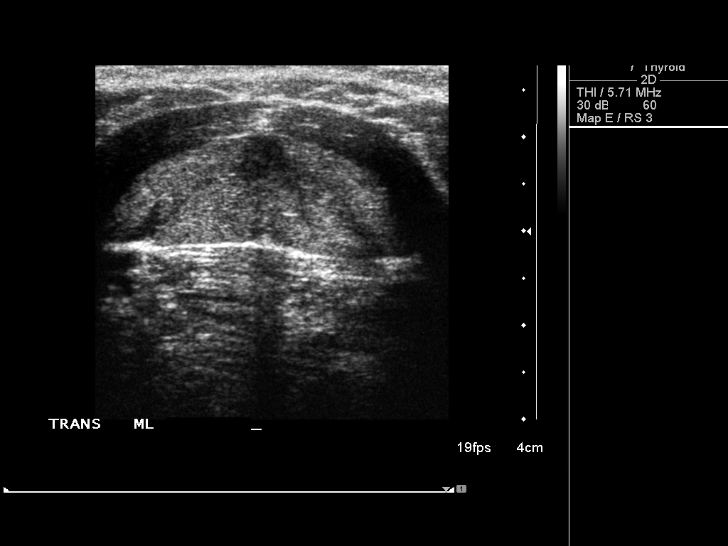
[im 2/15]
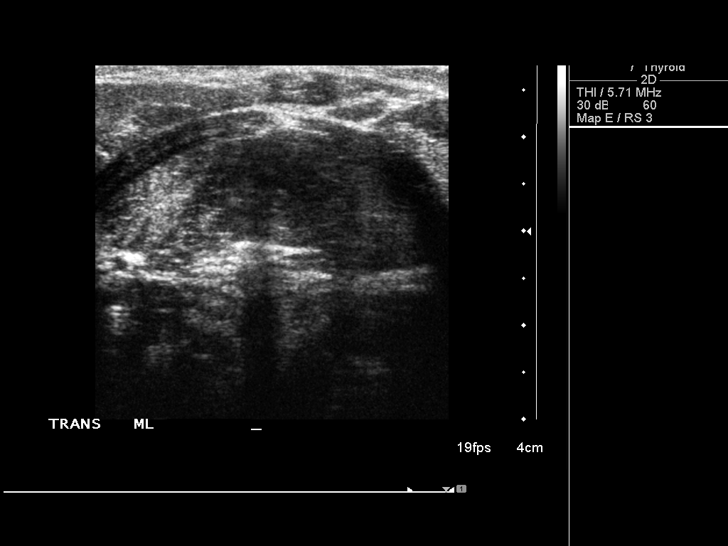
[im 3/15]
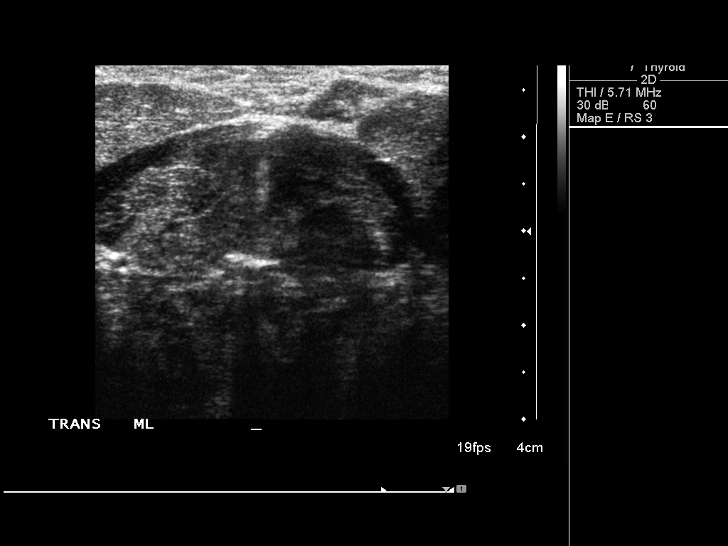
[im 4/15]
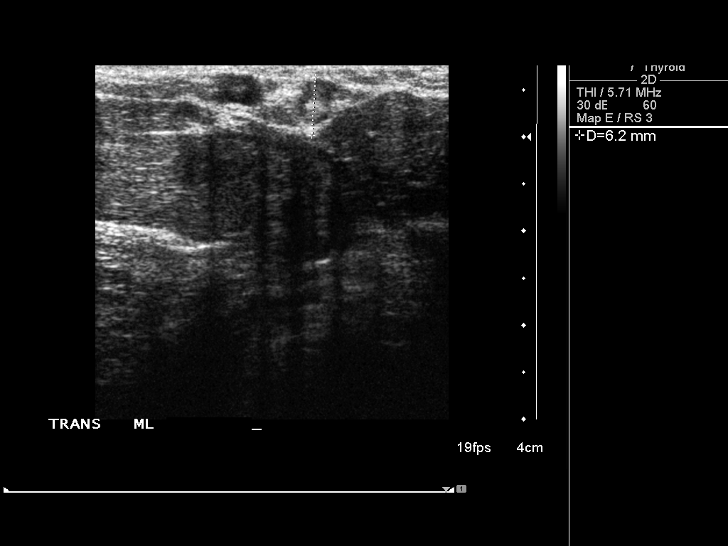
[im 5/15]
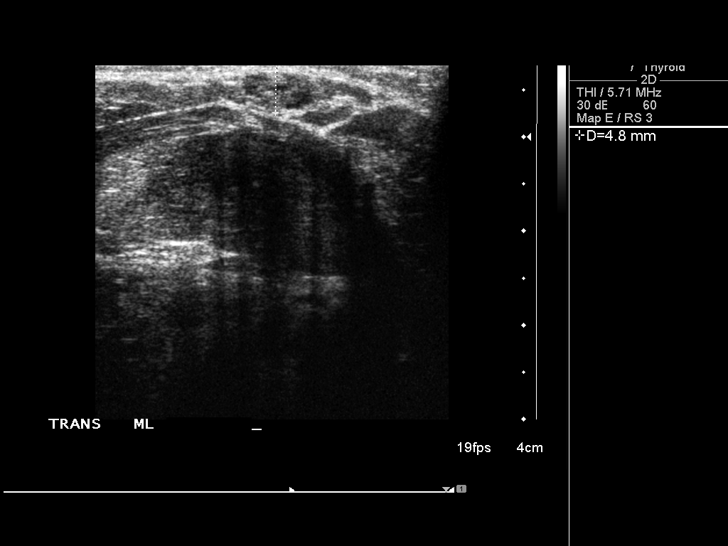
[im 6/15]
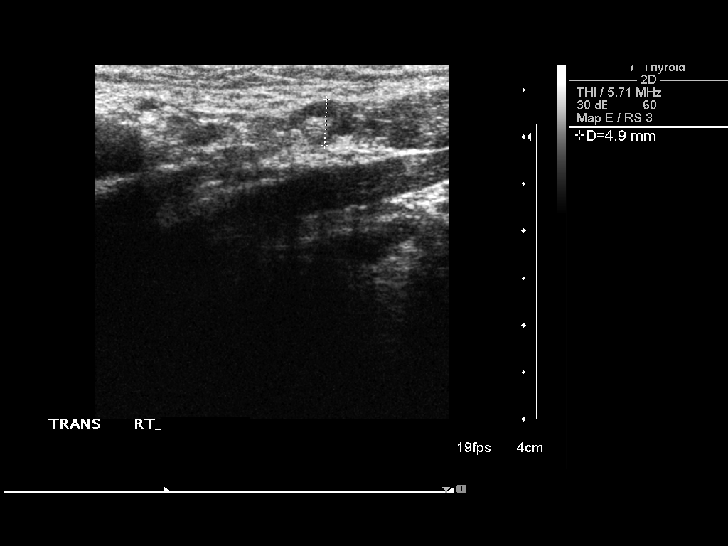
[im 7/15]
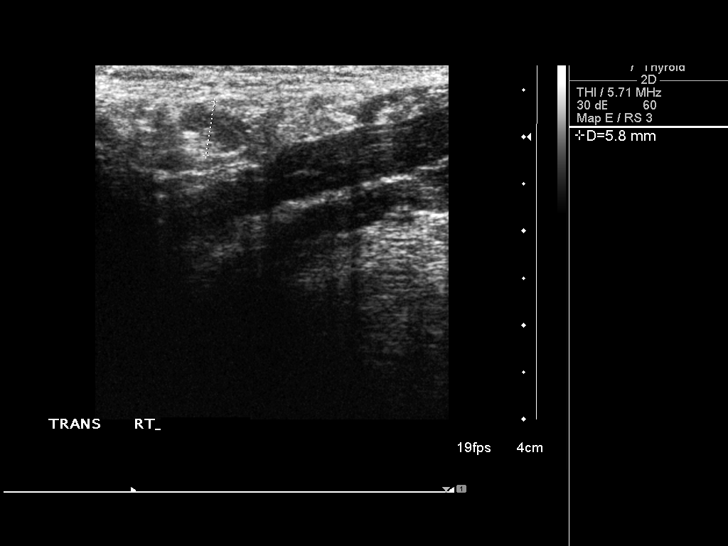
[im 9/15]
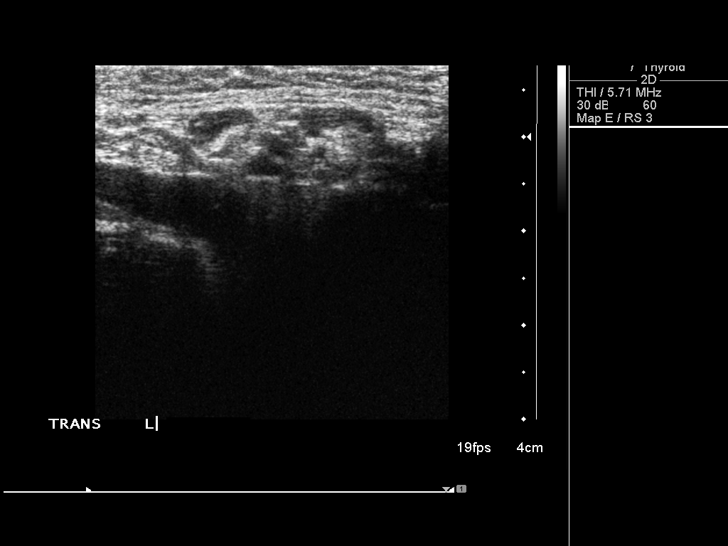
[im 10/15]
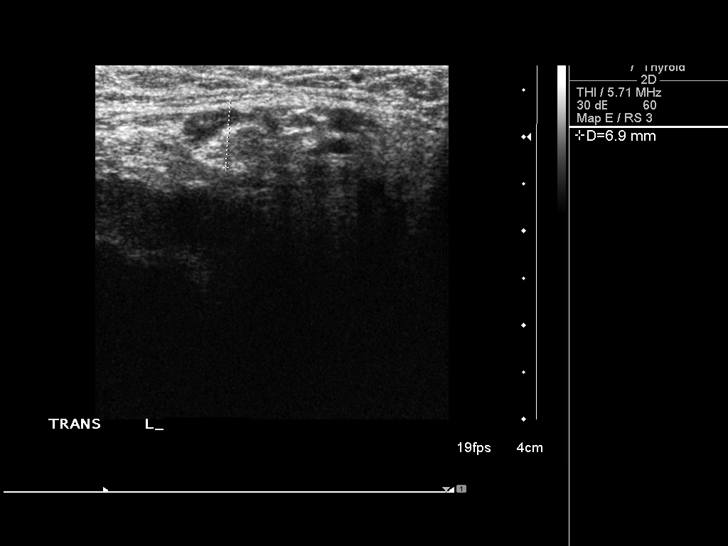
[im 11/15]
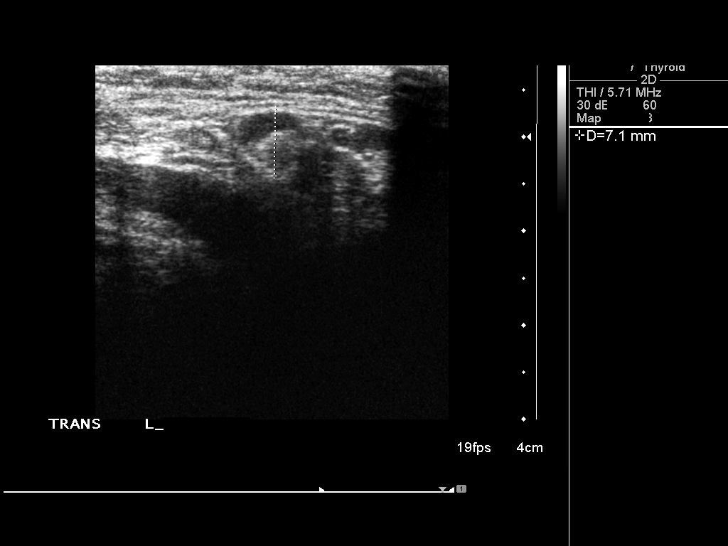
[im 12/15]
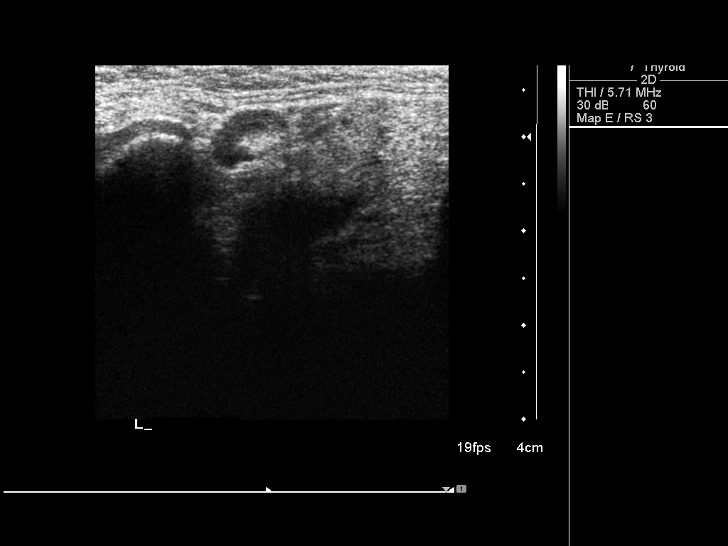
[im 13/15]
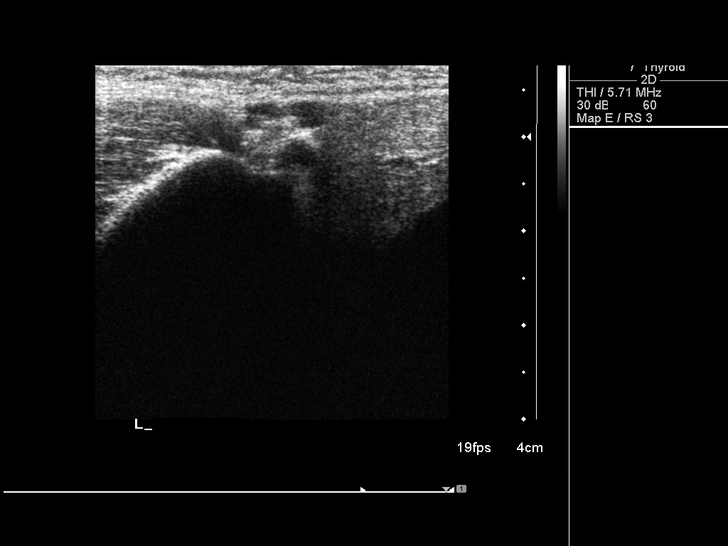
[im 14/15]
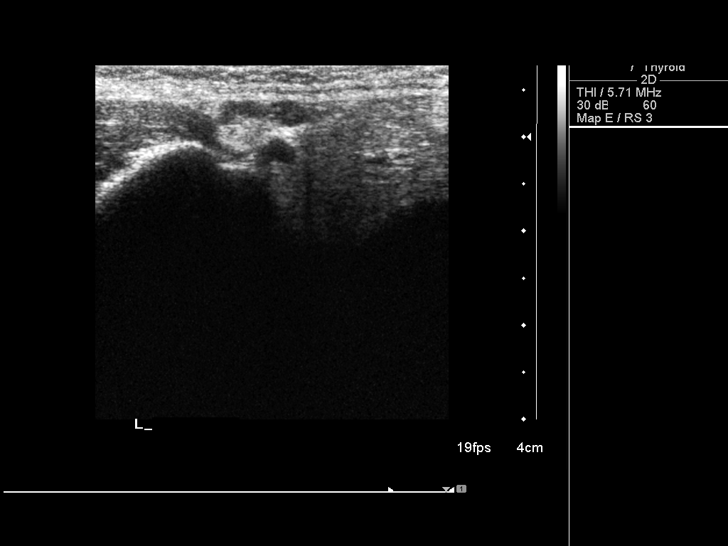
[im 15/15]
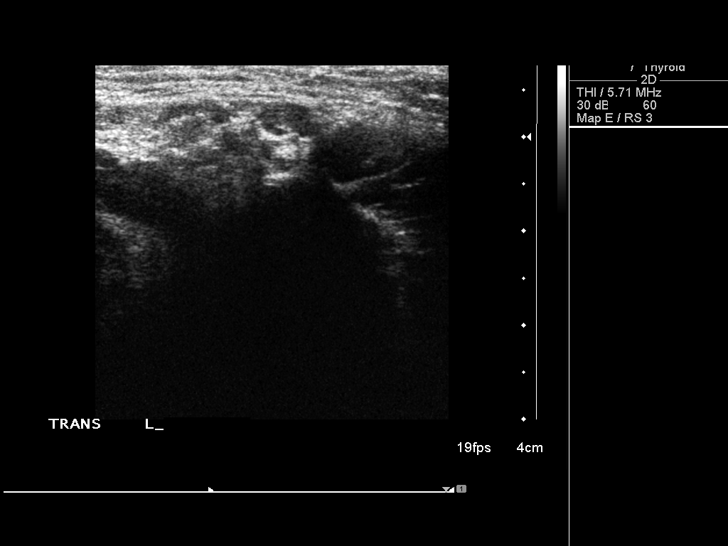

[14 of 15 positions shown; findings below may reference images not displayed]

FINDINGS: Directed grayscale duplex in the region of clinical concern in the
submandibular region.

No focal fluid collection.

Small lymph nodes are present which maintain their expected
architecture. No soft tissue lesion identified.
IMPRESSION: Sonographic survey in the submandibular region demonstrates small
lymph nodes with typical architecture, potentially reactive.
